# Patient Record
Sex: Female | Born: 1990
Health system: Southern US, Community
[De-identification: ages and names within clinical notes are randomized; demographics above are authoritative.]

## PROBLEM LIST (undated history)

## (undated) DIAGNOSIS — R112 Nausea with vomiting, unspecified: Secondary | ICD-10-CM

## (undated) DIAGNOSIS — Z9889 Other specified postprocedural states: Secondary | ICD-10-CM

## (undated) DIAGNOSIS — T4145XA Adverse effect of unspecified anesthetic, initial encounter: Secondary | ICD-10-CM

## (undated) DIAGNOSIS — T8859XA Other complications of anesthesia, initial encounter: Secondary | ICD-10-CM

## (undated) DIAGNOSIS — IMO0002 Reserved for concepts with insufficient information to code with codable children: Secondary | ICD-10-CM

## (undated) DIAGNOSIS — Z789 Other specified health status: Secondary | ICD-10-CM

## (undated) HISTORY — DX: Reserved for concepts with insufficient information to code with codable children: IMO0002

## (undated) HISTORY — PX: NO PAST SURGERIES: SHX2092

## (undated) HISTORY — PX: WISDOM TOOTH EXTRACTION: SHX21

---
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
## 2012-08-19 ENCOUNTER — Encounter: Payer: Self-pay | Admitting: *Deleted

## 2012-08-20 ENCOUNTER — Encounter: Payer: Self-pay | Admitting: Obstetrics and Gynecology

## 2012-08-20 ENCOUNTER — Encounter: Payer: Self-pay | Admitting: *Deleted

## 2012-08-20 ENCOUNTER — Ambulatory Visit (INDEPENDENT_AMBULATORY_CARE_PROVIDER_SITE_OTHER): Payer: 59 | Admitting: Obstetrics and Gynecology

## 2012-08-20 VITALS — BP 100/60 | Wt 148.0 lb

## 2012-08-20 DIAGNOSIS — N390 Urinary tract infection, site not specified: Secondary | ICD-10-CM

## 2012-08-20 LAB — POCT URINALYSIS DIPSTICK
Glucose, UA: NEGATIVE
Nitrite, UA: POSITIVE
Urobilinogen, UA: NEGATIVE

## 2012-08-20 MED ORDER — NITROFURANTOIN MONOHYD MACRO 100 MG PO CAPS: 100.0000 mg | ORAL_CAPSULE | Freq: Two times a day (BID) | ORAL | Status: DC

## 2012-08-20 NOTE — Patient Instructions (Signed)
Urinary Tract Infection Urinary tract infections (UTIs) can develop anywhere along your urinary tract. Your urinary tract is your body's drainage system for removing wastes and extra water. Your urinary tract includes two kidneys, two ureters, a bladder, and a urethra. Your kidneys are a pair of bean-shaped organs. Each kidney is about the size of your fist. They are located below your ribs, one on each side of your spine. CAUSES Infections are caused by microbes, which are microscopic organisms, including fungi, viruses, and bacteria. These organisms are so small that they can only be seen through a microscope. Bacteria are the microbes that most commonly cause UTIs. SYMPTOMS  Symptoms of UTIs may vary by age and gender of the patient and by the location of the infection. Symptoms in young women typically include a frequent and intense urge to urinate and a painful, burning feeling in the bladder or urethra during urination. Older women and men are more likely to be tired, shaky, and weak and have muscle aches and abdominal pain. A fever may mean the infection is in your kidneys. Other symptoms of a kidney infection include pain in your back or sides below the ribs, nausea, and vomiting. DIAGNOSIS To diagnose a UTI, your caregiver will ask you about your symptoms. Your caregiver also will ask to provide a urine sample. The urine sample will be tested for bacteria and white blood cells. White blood cells are made by your body to help fight infection. TREATMENT  Typically, UTIs can be treated with medication. Because most UTIs are caused by a bacterial infection, they usually can be treated with the use of antibiotics. The choice of antibiotic and length of treatment depend on your symptoms and the type of bacteria causing your infection. HOME CARE INSTRUCTIONS  If you were prescribed antibiotics, take them exactly as your caregiver instructs you. Finish the medication even if you feel better after you  have only taken some of the medication.  Drink enough water and fluids to keep your urine clear or pale yellow.  Avoid caffeine, tea, and carbonated beverages. They tend to irritate your bladder.  Empty your bladder often. Avoid holding urine for long periods of time.  Empty your bladder before and after sexual intercourse.  After a bowel movement, women should cleanse from front to back. Use each tissue only once. SEEK MEDICAL CARE IF:   You have back pain.  You develop a fever.  Your symptoms do not begin to resolve within 3 days. SEEK IMMEDIATE MEDICAL CARE IF:   You have severe back pain or lower abdominal pain.  You develop chills.  You have nausea or vomiting.  You have continued burning or discomfort with urination. MAKE SURE YOU:   Understand these instructions.  Will watch your condition.  Will get help right away if you are not doing well or get worse. Document Released: 01/03/2005 Document Revised: 09/25/2011 Document Reviewed: 05/04/2011 [employer] Patient Information 2013 [employer].  

## 2012-08-20 NOTE — Progress Notes (Signed)
S: 21 y.o.Single Caucasian female presents with burning with urination for a week, on and off.  . Pertinent negatives include The patient is having no constitutional symptoms, denying fever, chills, anorexia, or weight loss..  Symptoms related to post coital No Current method of birth control COC'S. Partner without change. Also some vaginal itching over the last week.   ROS: no weight loss, fever, night sweats  O:  WF in NAD.  No CVAT.  Abd soft with only minimal suprapubic tenderness.  Pelvic:  Ext nl, w/o erythema or lesions.  Vag with some thin d/c but wet prep was nl and pH was 4.5.  Cx nl.  BUS neg.  BM exam showed the uterus was small, mobile, and nontender.  Adnexa neg.       Diagnostic Test:    Urinalysis  Color, UA orange     Clarity, UA cloudy     Glucose, UA n     Bilirubin, UA n     Ketones, UA n     Spec Grav, UA    Blood, UA 50     pH, UA 5.0     Protein, UA small     Urobilinogen, UA negative     Nitrite, UA pos     Leukocytes, UA small (1+)      Specimen Collected: 08/20/12 2:00 PM       Assessment: UTI, no evidence of vaginitis  P:  Macrobid bid for 7 days.  Hydrate.  Return if sx's not improved in 48 hours.       Patient is given AVS

## 2012-11-06 ENCOUNTER — Telehealth: Payer: Self-pay | Admitting: Obstetrics & Gynecology

## 2012-11-06 NOTE — Telephone Encounter (Signed)
Patient mom "Vernona Rieger" says she is returning a call to [facility]. Re: forms for prescription refill

## 2012-11-07 MED ORDER — NORETHIN ACE-ETH ESTRAD-FE 1-20 MG-MCG PO TABS: 1.0000 | ORAL_TABLET | Freq: Every day | ORAL | Status: DC

## 2012-11-07 NOTE — Telephone Encounter (Signed)
Fax from [facility] stating that RX sent 04/2012 was filled at local pharmacy and mail order pharmacy will need new RX.  They can not transfer from local pharmacy.  RX sent to [facility] to last until AEX.

## 2013-04-14 ENCOUNTER — Other Ambulatory Visit: Payer: Self-pay | Admitting: Obstetrics & Gynecology

## 2013-04-14 MED ORDER — NORETHIN ACE-ETH ESTRAD-FE 1-20 MG-MCG PO TABS: ORAL_TABLET | ORAL | Status: DC

## 2013-04-14 NOTE — Addendum Note (Signed)
Addended by: Joselyn Arrow on: 04/14/2013 10:20 AM   Modules accepted: Orders

## 2013-05-12 ENCOUNTER — Ambulatory Visit (INDEPENDENT_AMBULATORY_CARE_PROVIDER_SITE_OTHER): Payer: 59 | Admitting: Obstetrics & Gynecology

## 2013-05-12 ENCOUNTER — Ambulatory Visit: Payer: Self-pay | Admitting: Obstetrics and Gynecology

## 2013-05-12 ENCOUNTER — Encounter: Payer: Self-pay | Admitting: Obstetrics & Gynecology

## 2013-05-12 VITALS — BP 102/76 | HR 80 | Resp 16 | Ht 66.0 in | Wt 154.0 lb

## 2013-05-12 DIAGNOSIS — Z0000 Encounter for general adult medical examination without abnormal findings: Secondary | ICD-10-CM

## 2013-05-12 DIAGNOSIS — Z01419 Encounter for gynecological examination (general) (routine) without abnormal findings: Secondary | ICD-10-CM

## 2013-05-12 LAB — POCT URINALYSIS DIPSTICK
BILIRUBIN UA: NEGATIVE
GLUCOSE UA: NEGATIVE
KETONES UA: NEGATIVE
Leukocytes, UA: NEGATIVE
NITRITE UA: NEGATIVE
PH UA: 8
Protein, UA: NEGATIVE
Urobilinogen, UA: NEGATIVE

## 2013-05-12 MED ORDER — NORETHIN ACE-ETH ESTRAD-FE 1-20 MG-MCG PO TABS: ORAL_TABLET | ORAL | Status: DC

## 2013-05-12 NOTE — Progress Notes (Signed)
23 y.o. G1P0010 SingleCaucasianF here for annual exam.  On OCPs, same one for years.  Doesn't usually cycle on this.  +SA with same person for 3 1/2 years.  Had Gc/Chl testing in 2013.  Declines testing today.  Has a small area at the rectum she wants me to check--is it a hemorrhoid?  No LMP recorded.          Sexually active: yes  The current method of family planning is Junel   Exercising: yes  Walking and Horse riding  Smoker:  no  Health Maintenance: Pap:  05/09/2012 - normal History of abnormal Pap:  Yes 2008 MMG:  None Colonoscopy:  none BMD:   none TDaP:  05/09/2012 Screening Labs: none, Hb today: 14.6, Urine today: Blood = trace.    reports that she quit smoking about 13 months ago. She has never used smokeless tobacco. She reports that she drinks about 7.0 ounces of alcohol per week. She reports that she does not use illicit drugs.  Past Medical History  Diagnosis Date  . 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  Current Outpatient Prescriptions  Medication Sig Dispense Refill  . nitrofurantoin, macrocrystal-monohydrate, (MACROBID) 100 MG capsule Take 1 capsule (100 mg total) by mouth 2 (two) times daily.  14 capsule  0  . norethindrone-ethinyl estradiol (GILDESS FE 1/20) 1-20 MG-MCG tablet TAKE 1 TABLET DAILY  3 Package  0   No current facility-administered medications for this visit.    Family History  Problem Relation Age of Onset  . Breast cancer Mother 46  . Diabetes Father     ROS:  Pertinent items are noted in HPI.  Otherwise, a comprehensive ROS was negative.  Exam:   BP 102/76  Pulse 80  Resp 16  Ht 5\' 6"  (1.676 m)  Wt 154 lb (69.854 kg)  BMI 24.87 kg/m2  Weight change: +2lb   Height: 5\' 6"  (167.6 cm)  Ht Readings from Last 3 Encounters:  05/12/13 5\' 6"  (1.676 m)    General appearance: alert, cooperative and appears stated age Head: Normocephalic, without obvious abnormality, atraumatic Neck: no adenopathy, supple,  symmetrical, trachea midline and thyroid normal to inspection and palpation Lungs: clear to auscultation bilaterally Breasts: normal appearance, no masses or tenderness Heart: regular rate and rhythm Abdomen: soft, non-tender; bowel sounds normal; no masses,  no organomegaly Extremities: extremities normal, atraumatic, no cyanosis or edema Skin: Skin color, texture, turgor normal. No rashes or lesions Lymph nodes: Cervical, supraclavicular, and axillary nodes normal. No abnormal inguinal nodes palpated Neurologic: Grossly normal   Pelvic: External genitalia:  no lesions              Urethra:  normal appearing urethra with no masses, tenderness or lesions              Bartholins and Skenes: normal                 Vagina: normal appearing vagina with normal color and discharge, no lesions              Cervix: no lesions              Pap taken: no Bimanual Exam:  Uterus:  normal size, contour, position, consistency, mobility, non-tender              Adnexa: normal adnexa and no mass, fullness, tenderness  Anus:  normal sphincter tone, no lesions, small hemorrhoid at 6 o'clock on the rectum  A:  Well Woman with normal exam Family hx of breast cancer--mother age 18  P:   Mammograms will be started at age 82.  Pt aware of this. pap smear with neg HR HPV testing 2/14.  No Pap today.   Declines STD testing today. Gildess rx to pharmacy.  #24mo supply/4RF return annually or prn  An After Visit Summary was printed and given to the patient.

## 2013-05-12 NOTE — Patient Instructions (Signed)

## 2013-05-13 LAB — HEMOGLOBIN, FINGERSTICK: Hemoglobin, fingerstick: 14.6 g/dL (ref 12.0–16.0)

## 2013-07-29 ENCOUNTER — Ambulatory Visit (INDEPENDENT_AMBULATORY_CARE_PROVIDER_SITE_OTHER): Payer: 59 | Admitting: Emergency Medicine

## 2013-07-29 ENCOUNTER — Encounter: Payer: Self-pay | Admitting: Emergency Medicine

## 2013-07-29 VITALS — BP 110/70 | HR 104 | Temp 100.8°F | Resp 18 | Ht 68.0 in | Wt 154.0 lb

## 2013-07-29 DIAGNOSIS — R509 Fever, unspecified: Secondary | ICD-10-CM

## 2013-07-29 DIAGNOSIS — J020 Streptococcal pharyngitis: Secondary | ICD-10-CM

## 2013-07-29 LAB — POCT RAPID STREP A (OFFICE): RAPID STREP A SCREEN: NEGATIVE

## 2013-07-29 MED ORDER — PREDNISONE 10 MG PO TABS: ORAL_TABLET | ORAL | Status: DC

## 2013-07-29 MED ORDER — AZITHROMYCIN 250 MG PO TABS: ORAL_TABLET | ORAL | Status: AC

## 2013-07-29 NOTE — Progress Notes (Signed)
   Subjective:    Patient ID: Deborah Simpson, female    DOB: 03-10-1991, 23 y.o.   MRN: [id number]  HPI Comments: 23 yo female with 3 days of increasing sore throat. She denies any relief with Advil/ Tylenol for fever and aches. She notes sore throat/ ears/ back ache.  She denies any recent exposures to strept.   Sore Throat   Fever  Associated symptoms include a sore throat.     Medication List       This list is accurate as of: 07/29/13  3:15 PM.  Always use your most recent med list.               norethindrone-ethinyl estradiol 1-20 MG-MCG tablet  Commonly known as:  GILDESS FE 1/20  TAKE 1 TABLET DAILY       No Known Allergies Past Medical History  Diagnosis Date  . Abnormal pap 09/08    CIN 1      Review of Systems  Constitutional: Positive for fever and fatigue.  HENT: Positive for sore throat.   Musculoskeletal: Positive for back pain.  All other systems reviewed and are negative.  BP 110/70  Pulse 104  Temp(Src) 100.8 F (38.2 C) (Temporal)  Resp 18  Ht 5\' 8"  (3.149 m)  Wt 154 lb (69.854 kg)  BMI 23.42 kg/m2  LMP 07/08/2013     Objective:   Physical Exam  Nursing note and vitals reviewed. Constitutional: She is oriented to person, place, and time. She appears well-developed and well-nourished.  HENT:  Head: Normocephalic and atraumatic.  Right Ear: External ear normal.  Left Ear: External ear normal.  Nose: Nose normal.  Mouth/Throat: Oropharyngeal exudate present.  2+ erythema, yellow bullous R>L  Eyes: Conjunctivae and EOM are normal.  Neck: Normal range of motion.  Cardiovascular: Normal rate, regular rhythm, normal heart sounds and intact distal pulses.   Pulmonary/Chest: Effort normal and breath sounds normal.  Abdominal: Soft. There is no tenderness.  Musculoskeletal: Normal range of motion.  Lymphadenopathy:    She has no cervical adenopathy.  Neurological: She is alert and oriented to person, place, and time.  Skin: Skin is  warm and dry.  Psychiatric: She has a normal mood and affect. Judgment normal.     Neg Rapid Strept     Assessment & Plan:  1. Prob Strept- ZPAK, Pred DP 10 mg Both AD Warm salt water gargles daily. 1 tsp liquid benadryl + 1 tsp liquid Maalox, MIX/ GARGLE/ SPIT as needed for pain. OOW x 3 days, w/c if SX increase or ER. Push fluids  2. Allergic rhinitis- Allegra OTC, increase H2o, allergy hygiene explained.

## 2013-07-29 NOTE — Patient Instructions (Signed)
FYI Peritonsillar Abscess Peritonsillar abscess is a collection of yellowish white fluid (pus) in the back of the throat. This fluid forms behind the tonsils. The treatment is most often drainage. This is done by:  Putting a needle into the abscess.  Cutting and draining the abscess. HOME CARE  If your abscess was drained today:  Mix 1 teaspoon of salt in 8 ounces of warm water for gargling.  Gargle the warm salt water.  Gargle 4 times per day or as needed for comfort. Do not swallow this mixture.  Rest in bed as needed.  Return to normal activity as soon as you can.  Apply cold to your neck for pain relief.  Put ice in a plastic bag.  Place a towel between your skin and the bag.  Leave the ice on for 15-20 minutes at a time, 03-04 times a day.  Eat a soft or liquid diet. Drink cold fluids. Cold fluids will soothe and take puffiness (swelling) down.  Only take medicine as told by your doctor.  Take all medicine as told. GET HELP RIGHT AWAY IF:   You are coughing up or throwing up (vomiting) blood.  Your throat pain is severe and pain medicine does not help it.  You have trouble talking or breathing.  You have trouble swallowing or eating.  You find it easier to breathe while leaning forward.  You have pain that gets worse.  You have pain, puffiness, redness, or drainage in your throat.  You have a fever.  You become dizzy, have a headache, have low energy, or feel sick.  You have signs of body fluid loss (dehydration). This includes lightheadedness when standing, peeing (urinating) less, a fast heart rate, or dry mouth and nose.  You start to drool. MAKE SURE YOU:  Understand these instructions.  Will watch your condition.  Will get help if you are not doing well or get worse. Document Released: 03/14/2009 Document Revised: 06/18/2011 Document Reviewed: 03/14/2009 [facility] Patient Information 2014 [place], [place]. Strep Throat Strep throat is an  infection of the throat caused by a bacteria named Streptococcus pyogenes. Your caregiver may call the infection streptococcal "tonsillitis" or "pharyngitis" depending on whether there are signs of inflammation in the tonsils or back of the throat. Strep throat is most common in children aged 5 15 years during the cold months of the year, but it can occur in people of any age during any season. This infection is spread from person to person (contagious) through coughing, sneezing, or other close contact. SYMPTOMS   Fever or chills.  Painful, swollen, red tonsils or throat.  Pain or difficulty when swallowing.  White or yellow spots on the tonsils or throat.  Swollen, tender lymph nodes or "glands" of the neck or under the jaw.  Red rash all over the body (rare). DIAGNOSIS  Many different infections can cause the same symptoms. A test must be done to confirm the diagnosis so the right treatment can be given. A "rapid strep test" can help your caregiver make the diagnosis in a few minutes. If this test is not available, a light swab of the infected area can be used for a throat culture test. If a throat culture test is done, results are usually available in a day or two. TREATMENT  Strep throat is treated with antibiotic medicine. HOME CARE INSTRUCTIONS   Gargle with 1 tsp of salt in 1 cup of warm water, 3 4 times per day or as needed for comfort.  Family members who also have a sore throat or fever should be tested for strep throat and treated with antibiotics if they have the strep infection.  Make sure everyone in your household washes their hands well.  Do not share food, drinking cups, or personal items that could cause the infection to spread to others.  You may need to eat a soft food diet until your sore throat gets better.  Drink enough water and fluids to keep your urine clear or pale yellow. This will help prevent dehydration.  Get plenty of rest.  Stay home from school,  daycare, or work until you have been on antibiotics for 24 hours.  Only take over-the-counter or prescription medicines for pain, discomfort, or fever as directed by your caregiver.  If antibiotics are prescribed, take them as directed. Finish them even if you start to feel better. SEEK MEDICAL CARE IF:   The glands in your neck continue to enlarge.  You develop a rash, cough, or earache.  You cough up green, yellow-brown, or bloody sputum.  You have pain or discomfort not controlled by medicines.  Your problems seem to be getting worse rather than better. SEEK IMMEDIATE MEDICAL CARE IF:   You develop any new symptoms such as vomiting, severe headache, stiff or painful neck, chest pain, shortness of breath, or trouble swallowing.  You develop severe throat pain, drooling, or changes in your voice.  You develop swelling of the neck, or the skin on the neck becomes red and tender.  You have a fever.  You develop signs of dehydration, such as fatigue, dry mouth, and decreased urination.  You become increasingly sleepy, or you cannot wake up completely. Document Released: 03/23/2000 Document Revised: 03/12/2012 Document Reviewed: 05/25/2010 [facility] Patient Information 2014 [place], [place].

## 2013-07-30 DIAGNOSIS — J020 Streptococcal pharyngitis: Secondary | ICD-10-CM | POA: Insufficient documentation

## 2014-01-11 ENCOUNTER — Encounter: Payer: Self-pay | Admitting: Physician Assistant

## 2014-01-11 ENCOUNTER — Ambulatory Visit (INDEPENDENT_AMBULATORY_CARE_PROVIDER_SITE_OTHER): Payer: 59 | Admitting: Physician Assistant

## 2014-01-11 VITALS — BP 120/78 | HR 84 | Temp 98.5°F | Resp 16 | Ht 68.0 in | Wt 158.0 lb

## 2014-01-11 DIAGNOSIS — R300 Dysuria: Secondary | ICD-10-CM

## 2014-01-11 DIAGNOSIS — R109 Unspecified abdominal pain: Secondary | ICD-10-CM

## 2014-01-11 MED ORDER — CYCLOBENZAPRINE HCL 10 MG PO TABS: 10.0000 mg | ORAL_TABLET | Freq: Three times a day (TID) | ORAL | Status: DC | PRN

## 2014-01-11 MED ORDER — MELOXICAM 15 MG PO TABS: ORAL_TABLET | ORAL | Status: DC

## 2014-01-11 NOTE — Patient Instructions (Signed)
Flank Pain °Flank pain refers to pain that is located on the side of the body between the upper abdomen and the back. The pain may occur over a short period of time (acute) or may be long-term or reoccurring (chronic). It may be mild or severe. Flank pain can be caused by many things. °CAUSES  °Some of the more common causes of flank pain include: °· Muscle strains.   °· Muscle spasms.   °· A disease of your spine (vertebral disk disease).   °· A lung infection (pneumonia).   °· Fluid around your lungs (pulmonary edema).   °· A kidney infection.   °· Kidney stones.   °· A very painful skin rash caused by the chickenpox virus (shingles).   °· Gallbladder disease.   °HOME CARE INSTRUCTIONS  °Home care will depend on the cause of your pain. In general, °· Rest as directed by your caregiver. °· Drink enough fluids to keep your urine clear or pale yellow. °· Only take over-the-counter or prescription medicines as directed by your caregiver. Some medicines may help relieve the pain. °· Tell your caregiver about any changes in your pain. °· Follow up with your caregiver as directed. °SEEK IMMEDIATE MEDICAL CARE IF:  °· Your pain is not controlled with medicine.   °· You have new or worsening symptoms. °· Your pain increases.   °· You have abdominal pain.   °· You have shortness of breath.   °· You have persistent nausea or vomiting.   °· You have swelling in your abdomen.   °· You feel faint or pass out.   °· You have blood in your urine. °· You have a fever or persistent symptoms for more than 2-3 days. °· You have a fever and your symptoms suddenly get worse. °MAKE SURE YOU:  °· Understand these instructions. °· Will watch your condition. °· Will get help right away if you are not doing well or get worse. °Document Released: 05/17/2005 Document Revised: 12/19/2011 Document Reviewed: 11/08/2011 °[employer]® Patient Information ©2015 [employer]. This information is not intended to replace advice given to you by your  health care provider. Make sure you discuss any questions you have with your health care provider. ° °

## 2014-01-11 NOTE — Progress Notes (Signed)
   Subjective:    Patient ID: Deborah Simpson, female    DOB: Jun 24, 1990, 23 y.o.   MRN: [id number]  HPI 23 y.o. female with dysuria and lower back pain over the weekend. Friday started with frequency, cloudy urine, with dysuria with urination, she increased water/cranberry juice and took OTC azo, she then started to have right sided/flank pain with some radiation to front. Currently she denies urinary symptoms but continue to have lower back pain. Denies fever, chills, nausea, diarrhea, vomiting. Has been seeing a chiropractor for a pulled muscle in lower back.   Sexually active same boyfriend for 3-4 years, on BCP. Last menses beginning of last month, does not have menses every month.    Review of Systems  Constitutional: Negative.   HENT: Negative.   Respiratory: Negative.   Cardiovascular: Negative.   Gastrointestinal: Negative.   Genitourinary: Positive for dysuria, frequency and flank pain. Negative for urgency, hematuria, decreased urine volume, vaginal bleeding, vaginal discharge, enuresis, difficulty urinating, genital sores, vaginal pain, menstrual problem, pelvic pain and dyspareunia.  Musculoskeletal: Positive for back pain. Negative for arthralgias, gait problem, joint swelling and myalgias.  Neurological: Negative.        Objective:   Physical Exam  Constitutional: She is oriented to person, place, and time. She appears well-developed and well-nourished.  Neck: Normal range of motion. Neck supple.  Cardiovascular: Normal rate and regular rhythm.   Pulmonary/Chest: Effort normal and breath sounds normal.  Abdominal: Soft. Bowel sounds are normal. She exhibits no distension and no mass. There is tenderness (suprapubic ). There is no rebound and no guarding.  Musculoskeletal: Normal range of motion. She exhibits no tenderness.  Neurological: She is alert and oriented to person, place, and time.  Skin: Skin is warm and dry.      Assessment & Plan:  Right flank pain with  urinary symptoms- + sexually active ? UTI, kidney stone, kidney infection, preg, musculoskeletal Check Urine and pregnancy test, CBC CMET.

## 2014-01-12 LAB — COMPREHENSIVE METABOLIC PANEL
ALT: 21 U/L (ref 0–35)
AST: 14 U/L (ref 0–37)
Albumin: 4.3 g/dL (ref 3.5–5.2)
Alkaline Phosphatase: 62 U/L (ref 39–117)
BILIRUBIN TOTAL: 0.4 mg/dL (ref 0.2–1.2)
BUN: 9 mg/dL (ref 6–23)
CO2: 28 mEq/L (ref 19–32)
Calcium: 9.3 mg/dL (ref 8.4–10.5)
Chloride: 100 mEq/L (ref 96–112)
Creat: 0.84 mg/dL (ref 0.50–1.10)
GLUCOSE: 91 mg/dL (ref 70–99)
POTASSIUM: 3.7 meq/L (ref 3.5–5.3)
SODIUM: 139 meq/L (ref 135–145)
TOTAL PROTEIN: 6.8 g/dL (ref 6.0–8.3)

## 2014-01-12 LAB — CBC WITH DIFFERENTIAL/PLATELET
BASOS PCT: 0 % (ref 0–1)
Basophils Absolute: 0 10*3/uL (ref 0.0–0.1)
EOS ABS: 0 10*3/uL (ref 0.0–0.7)
Eosinophils Relative: 0 % (ref 0–5)
HCT: 41.5 % (ref 36.0–46.0)
Hemoglobin: 13.9 g/dL (ref 12.0–15.0)
Lymphocytes Relative: 22 % (ref 12–46)
Lymphs Abs: 3 10*3/uL (ref 0.7–4.0)
MCH: 28.7 pg (ref 26.0–34.0)
MCHC: 33.5 g/dL (ref 30.0–36.0)
MCV: 85.6 fL (ref 78.0–100.0)
MONOS PCT: 7 % (ref 3–12)
Monocytes Absolute: 0.9 10*3/uL (ref 0.1–1.0)
NEUTROS ABS: 9.6 10*3/uL — AB (ref 1.7–7.7)
NEUTROS PCT: 71 % (ref 43–77)
Platelets: 343 10*3/uL (ref 150–400)
RBC: 4.85 MIL/uL (ref 3.87–5.11)
RDW: 13 % (ref 11.5–15.5)
WBC: 13.5 10*3/uL — ABNORMAL HIGH (ref 4.0–10.5)

## 2014-01-12 LAB — URINALYSIS, ROUTINE W REFLEX MICROSCOPIC
Bilirubin Urine: NEGATIVE
GLUCOSE, UA: NEGATIVE mg/dL
KETONES UR: NEGATIVE mg/dL
Nitrite: POSITIVE — AB
Protein, ur: NEGATIVE mg/dL
Urobilinogen, UA: 0.2 mg/dL (ref 0.0–1.0)
pH: 6 (ref 5.0–8.0)

## 2014-01-12 LAB — URINALYSIS, MICROSCOPIC ONLY
Casts: NONE SEEN
Crystals: NONE SEEN
SQUAMOUS EPITHELIAL / LPF: NONE SEEN

## 2014-01-12 MED ORDER — CIPROFLOXACIN HCL 500 MG PO TABS: 500.0000 mg | ORAL_TABLET | Freq: Two times a day (BID) | ORAL | Status: AC

## 2014-01-12 NOTE — Addendum Note (Signed)
Addended by: Vicie Mutters R on: 01/12/2014 08:39 AM   Modules accepted: Orders

## 2014-01-14 LAB — URINE CULTURE

## 2014-02-08 ENCOUNTER — Encounter: Payer: Self-pay | Admitting: Physician Assistant

## 2014-02-15 ENCOUNTER — Other Ambulatory Visit: Payer: Self-pay

## 2014-05-31 ENCOUNTER — Ambulatory Visit: Payer: 59 | Admitting: Obstetrics & Gynecology

## 2014-06-08 ENCOUNTER — Other Ambulatory Visit: Payer: Self-pay | Admitting: Obstetrics & Gynecology

## 2014-06-08 NOTE — Telephone Encounter (Signed)
Medication refill request: Gildess 1/20  Last AEX:  05/12/13 with SM Next AEX: 06/14/14 with SM Last MMG (if hormonal medication request): N/A Refill authorized: None- Patient has enough refills to last her  Called patient to try and schedule her for AEX. Scheduled her for 06/14/14 at 12:45 with Dr. Sabra Heck. Asked patient if we needed to send in refills for her. She said she has plenty to last her until AEX.  Rx denied through our system  Routed to provider for review, encounter closed.

## 2014-06-14 ENCOUNTER — Ambulatory Visit (INDEPENDENT_AMBULATORY_CARE_PROVIDER_SITE_OTHER): Payer: 59 | Admitting: Obstetrics & Gynecology

## 2014-06-14 ENCOUNTER — Encounter: Payer: Self-pay | Admitting: Obstetrics & Gynecology

## 2014-06-14 VITALS — BP 120/62 | HR 64 | Resp 16 | Ht 67.0 in | Wt 158.0 lb

## 2014-06-14 DIAGNOSIS — Z124 Encounter for screening for malignant neoplasm of cervix: Secondary | ICD-10-CM | POA: Diagnosis not present

## 2014-06-14 DIAGNOSIS — Z01419 Encounter for gynecological examination (general) (routine) without abnormal findings: Secondary | ICD-10-CM

## 2014-06-14 DIAGNOSIS — Z0000 Encounter for general adult medical examination without abnormal findings: Secondary | ICD-10-CM | POA: Diagnosis not present

## 2014-06-14 LAB — POCT URINALYSIS DIPSTICK
Bilirubin, UA: NEGATIVE
Glucose, UA: NEGATIVE
KETONES UA: NEGATIVE
NITRITE UA: NEGATIVE
PROTEIN UA: NEGATIVE
Urobilinogen, UA: NEGATIVE
pH, UA: 5

## 2014-06-14 LAB — HEMOGLOBIN, FINGERSTICK: HEMOGLOBIN, FINGERSTICK: 14.4 g/dL (ref 12.0–16.0)

## 2014-06-14 MED ORDER — NORETHIN ACE-ETH ESTRAD-FE 1-20 MG-MCG PO TABS: ORAL_TABLET | ORAL | Status: DC

## 2014-06-14 NOTE — Progress Notes (Signed)
24 y.o. G1P0010 SingleCaucasianF here for annual exam.  Doing well.  Cycles normal.  Mother with hx of breast cancer age 37.  Pt aware screening will start at age 70.   Still dating same person.  They have been together for almost five years.      Patient's last menstrual period was 05/28/2014.          Sexually active: Yes.    The current method of family planning is OCP (estrogen/progesterone).    Exercising: Yes.    walking dogs Smoker:  no  Health Maintenance: Pap:  05/09/12 WNL negative HR HPV History of abnormal Pap:  yes MMG:  Breast US-normal Colonoscopy:  none BMD:   none TDaP:  05/09/12 Screening Labs: normal CMP/hb 10/15, Hb today: 14.4, Urine today: WBC-trace, RBC-1+   reports that she has never smoked. She has never used smokeless tobacco. She reports that she drinks about 7.0 oz of alcohol per week. She reports that she does not use illicit drugs.  Past Medical History  Diagnosis Date  . 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  Current Outpatient Prescriptions  Medication Sig Dispense Refill  . norethindrone-ethinyl estradiol (GILDESS FE 1/20) 1-20 MG-MCG tablet TAKE 1 TABLET DAILY 3 Package 4   No current facility-administered medications for this visit.    Family History  Problem Relation Age of Onset  . Breast cancer Mother 38  . Diabetes Father     ROS:  Pertinent items are noted in HPI.  Otherwise, a comprehensive ROS was negative.  Exam:   General appearance: alert, cooperative and appears stated age Head: Normocephalic, without obvious abnormality, atraumatic Neck: no adenopathy, supple, symmetrical, trachea midline and thyroid normal to inspection and palpation Lungs: clear to auscultation bilaterally Breasts: normal appearance, no masses or tenderness Heart: regular rate and rhythm Abdomen: soft, non-tender; bowel sounds normal; no masses,  no organomegaly Extremities: extremities normal, atraumatic, no cyanosis or  edema Skin: Skin color, texture, turgor normal. No rashes or lesions Lymph nodes: Cervical, supraclavicular, and axillary nodes normal. No abnormal inguinal nodes palpated Neurologic: Grossly normal   Pelvic: External genitalia:  no lesions              Urethra:  normal appearing urethra with no masses, tenderness or lesions              Bartholins and Skenes: normal                 Vagina: normal appearing vagina with normal color and discharge, no lesions              Cervix: no lesions              Pap taken: Yes.   Bimanual Exam:  Uterus:  normal size, contour, position, consistency, mobility, non-tender              Adnexa: normal adnexa and no mass, fullness, tenderness               Rectovaginal: Confirms               Anus:  normal sphincter tone, no lesions  Chaperone was present for exam.  A:  Well Woman with normal exam Family hx of breast cancer--mother age 30 On OCPs for contraception  P: Mammograms will be started at age 63. Pt aware of this. pap smear with neg HR HPV testing 2/14. Pap only today.  Declines STD  testing due to being in long term relationship Gildess rx to pharmacy. #56mo supply/4RF return annually or prn

## 2014-06-15 LAB — IPS PAP TEST WITH REFLEX TO HPV

## 2015-05-10 ENCOUNTER — Encounter: Payer: Self-pay | Admitting: Internal Medicine

## 2015-05-10 ENCOUNTER — Ambulatory Visit (INDEPENDENT_AMBULATORY_CARE_PROVIDER_SITE_OTHER): Payer: 59 | Admitting: Internal Medicine

## 2015-05-10 VITALS — BP 138/76 | HR 96 | Temp 98.6°F | Resp 18 | Ht 68.0 in | Wt 162.0 lb

## 2015-05-10 DIAGNOSIS — J069 Acute upper respiratory infection, unspecified: Secondary | ICD-10-CM | POA: Diagnosis not present

## 2015-05-10 MED ORDER — PROMETHAZINE-DM 6.25-15 MG/5ML PO SYRP: ORAL_SOLUTION | ORAL | Status: DC

## 2015-05-10 MED ORDER — PREDNISONE 20 MG PO TABS: ORAL_TABLET | ORAL | Status: DC

## 2015-05-10 NOTE — Progress Notes (Signed)
Patient ID: Deborah Simpson, female   DOB: 1991-02-01, 25 y.o.   MRN: [id number]  HPI  Patient presents to the office for evaluation of cough.  It has been going on for 3 days.  Patient reports minimal coughing.  They also endorse change in voice, chills, postnasal drip, shortness of breath and body aches, nasal congestion, rhinorrhea. .  They have tried nasal saline.  They report that nothing has worked.  They admits to other sick contacts.  Review of Systems  Constitutional: Positive for chills and malaise/fatigue. Negative for fever.  HENT: Positive for congestion. Negative for ear pain and sore throat.   Respiratory: Positive for cough. Negative for shortness of breath and wheezing.   Skin: Negative.   Neurological: Positive for headaches.    PE: Filed Vitals:   05/10/15 1425  BP: 138/76  Pulse: 96  Temp: 98.6 F (37 C)  Resp: 18     General:  Alert and non-toxic, WDWN, NAD HEENT: NCAT, PERLA, EOM normal, no occular discharge or erythema.  Nasal mucosal edema with sinus tenderness to palpation.  Oropharynx clear with minimal oropharyngeal edema and erythema.  Mucous membranes moist and pink. Neck:  Cervical adenopathy Chest:  RRR no MRGs.  Lungs clear to auscultation A&P with no wheezes rhonchi or rales.   Abdomen: +BS x 4 quadrants, soft, non-tender, no guarding, rigidity, or rebound. Skin: warm and dry no rash Neuro: A&Ox4, CN II-XII grossly intact  Assessment and Plan:   1. Acute URI -prednisone -flonase -antihistamine -phenergan dm -followup prn

## 2015-05-10 NOTE — Patient Instructions (Signed)
Please take prednisone as prescribed until it is gone.  Please take claritin, zyrtec, or allegra daily until this resolves.  Please 2 sprays of flonase, nasacort, or rhinocort per nostril until you feel better.  Please use saline in your nose as often as tolerated.  Please take cough syrup as needed for cough and congestion.

## 2015-06-10 ENCOUNTER — Ambulatory Visit (INDEPENDENT_AMBULATORY_CARE_PROVIDER_SITE_OTHER): Payer: 59 | Admitting: Internal Medicine

## 2015-06-10 ENCOUNTER — Encounter: Payer: Self-pay | Admitting: Internal Medicine

## 2015-06-10 VITALS — BP 122/74 | HR 90 | Temp 98.4°F | Resp 18 | Ht 68.0 in | Wt 158.0 lb

## 2015-06-10 DIAGNOSIS — F909 Attention-deficit hyperactivity disorder, unspecified type: Secondary | ICD-10-CM

## 2015-06-10 DIAGNOSIS — F988 Other specified behavioral and emotional disorders with onset usually occurring in childhood and adolescence: Secondary | ICD-10-CM

## 2015-06-10 MED ORDER — BUPROPION HCL ER (XL) 150 MG PO TB24: 150.0000 mg | ORAL_TABLET | ORAL | Status: DC

## 2015-06-10 NOTE — Progress Notes (Signed)
   Subjective:    Patient ID: Deborah Simpson, female    DOB: [birth year], 25 y.o.   MRN: [id number]  HPI  Patient presents to the office for evaluation of difficulty with concentrating.  She reports that she has had some issues with this back in high school but now it is even worse in college.  She reports that she is having one class in particular that she is having trouble passing and that is anatomy and physiology.  She reports that this is the third time that she has taken it.  She reports that she tries to study a lot.  She does flash cards and that seems to help.  She will think about being outside or riding her horse. She does most of her studying in a quiet room.  She does take breaks in her studying.   She feels like life outside of school is fine.  She likes to spend time with her horses.  She reports that she occasional has mood swings that can last 3-4 times per week.  She reports that she is sleeping well.  She doesn't report sleeping too much.  She does report that she is constantly worrying and constantly anxious.  No guilty feelings.  She has some weight gain.      Review of Systems  Constitutional: Negative for fever, chills and fatigue.  Respiratory: Negative for chest tightness and shortness of breath.   Cardiovascular: Negative for chest pain and palpitations.  Gastrointestinal: Negative for nausea, vomiting and abdominal pain.  Genitourinary: Negative.   Psychiatric/Behavioral: Positive for decreased concentration. Negative for confusion and agitation. The patient is nervous/anxious.        Objective:   Physical Exam  Constitutional: She is oriented to person, place, and time. She appears well-developed and well-nourished. No distress.  HENT:  Head: Normocephalic.  Mouth/Throat: Oropharynx is clear and moist. No oropharyngeal exudate.  Eyes: Conjunctivae are normal. No scleral icterus.  Neck: Normal range of motion. Neck supple. No JVD present. No thyromegaly present.   Cardiovascular: Normal rate, regular rhythm, normal heart sounds and intact distal pulses.  Exam reveals no gallop and no friction rub.   No murmur heard. Pulmonary/Chest: Effort normal and breath sounds normal. No respiratory distress. She has no wheezes. She has no rales. She exhibits no tenderness.  Abdominal: Soft. Bowel sounds are normal. She exhibits no distension and no mass. There is no tenderness. There is no rebound and no guarding.  Musculoskeletal: Normal range of motion.  Lymphadenopathy:    She has no cervical adenopathy.  Neurological: She is alert and oriented to person, place, and time.  Skin: Skin is warm and dry. She is not diaphoretic.  Psychiatric: She has a normal mood and affect. Her behavior is normal. Judgment and thought content normal.  Nursing note and vitals reviewed.   Filed Vitals:   06/10/15 1033  BP: 122/74  Pulse: 90  Temp: 98.4 F (36.9 C)  Resp: 18        Assessment & Plan:    1. ADD (attention deficit disorder) -likely a crossover of anxiety -wellbutrin 150 -recheck 1 month -daily fish oil -recommended that she get set up with a tutor and also meet with the professor for tips understanding difficult subject

## 2015-06-10 NOTE — Patient Instructions (Signed)

## 2015-07-12 ENCOUNTER — Ambulatory Visit (INDEPENDENT_AMBULATORY_CARE_PROVIDER_SITE_OTHER): Payer: 59 | Admitting: Internal Medicine

## 2015-07-12 ENCOUNTER — Encounter: Payer: Self-pay | Admitting: Internal Medicine

## 2015-07-12 VITALS — BP 118/72 | HR 88 | Temp 98.2°F | Resp 18 | Ht 68.0 in | Wt 162.0 lb

## 2015-07-12 DIAGNOSIS — F988 Other specified behavioral and emotional disorders with onset usually occurring in childhood and adolescence: Secondary | ICD-10-CM

## 2015-07-12 DIAGNOSIS — F909 Attention-deficit hyperactivity disorder, unspecified type: Secondary | ICD-10-CM

## 2015-07-12 MED ORDER — AMPHETAMINE-DEXTROAMPHETAMINE 20 MG PO TABS: ORAL_TABLET | ORAL | Status: DC

## 2015-07-12 NOTE — Progress Notes (Signed)
Assessment and Plan:   1. ADD (attention deficit disorder) -d/c wellbutrin --adderall 20 mg daily for 5 days per week -precautions for use listed -patient to send mychart message in 2 weeks about how she is doing with medications.   HPI 25 y.o.female presents for 1 month follow up of issues with concentration.  She was started on Wellbutrin at her last visit. Patient reports that they have been doing well.  female is taking their medication.  They are not having difficulty with their medications.  They report no adverse reactions.  She reports that she noticed no changes in stress or with her focus issues.  She would like to try something.    Past Medical History  Diagnosis Date  . Abnormal pap 09/08    CIN 1     No Known Allergies    Current Outpatient Prescriptions on File Prior to Visit  Medication Sig Dispense Refill  . buPROPion (WELLBUTRIN XL) 150 MG 24 hr tablet Take 1 tablet (150 mg total) by mouth every morning. 30 tablet 2  . norethindrone-ethinyl estradiol (GILDESS FE 1/20) 1-20 MG-MCG tablet TAKE 1 TABLET DAILY 3 Package 4   No current facility-administered medications on file prior to visit.    ROS: all negative except above.   Physical Exam: Filed Weights   07/12/15 1126  Weight: 162 lb (73.483 kg)   BP 118/72 mmHg  Pulse 88  Temp(Src) 98.2 F (36.8 C) (Temporal)  Resp 18  Ht 5\' 8"  (1.727 m)  Wt 162 lb (73.483 kg)  BMI 24.64 kg/m2  LMP 06/28/2015 General Appearance: Well developed well nourished, non-toxic appearing in no apparent distress. Eyes: PERRLA, EOMs, conjunctiva w/ no swelling or erythema or discharge Sinuses: No Frontal/maxillary tenderness ENT/Mouth: Ear canals clear without swelling or erythema.  TM's normal bilaterally with no retractions, bulging, or loss of landmarks.   Neck: Supple, thyroid normal, no notable JVD  Respiratory: Respiratory effort normal, Clear breath sounds anteriorly and posteriorly bilaterally without rales, rhonchi,  wheezing or stridor. No retractions or accessory muscle usage. Cardio: RRR with no MRGs.   Abdomen: Soft, + BS.  Non tender, no guarding, rebound, hernias, masses.  Musculoskeletal: Full ROM, 5/5 strength, normal gait.  Skin: Warm, dry without rashes  Neuro: Awake and oriented X 3, Cranial nerves intact. Normal muscle tone, no cerebellar symptoms. Sensation intact.  Psych: normal affect, Insight and Judgment appropriate.     Starlyn Skeans, PA-C 11:54 AM [facility]

## 2015-07-12 NOTE — Patient Instructions (Signed)
Amphetamine; Dextroamphetamine tablets What is this medicine? AMPHETAMINE; DEXTROAMPHETAMINE(am FET a meen; dex troe am FET a meen) is used to treat attention-deficit hyperactivity disorder (ADHD). It may also be used for narcolepsy. Federal law prohibits giving this medicine to any person other than the person for whom it was prescribed. Do not share this medicine with anyone else. This medicine may be used for other purposes; ask your health care provider or pharmacist if you have questions. What should I tell my health care provider before I take this medicine? They need to know if you have any of these conditions: -anxiety or panic attacks -circulation problems in fingers and toes -glaucoma -hardening or blockages of the arteries or heart blood vessels -heart disease or a heart defect -high blood pressure -history of a drug or alcohol abuse problem -history of stroke -kidney disease -liver disease -mental illness -seizures -suicidal thoughts, plans, or attempt; a previous suicide attempt by you or a family member -thyroid disease -Tourette's syndrome -an unusual or allergic reaction to dextroamphetamine, other amphetamines, other medicines, foods, dyes, or preservatives -pregnant or trying to get pregnant -breast-feeding How should I use this medicine? Take this medicine by mouth with a glass of water. Follow the directions on the prescription label. Take your doses at regular intervals. Do not take your medicine more often than directed. Do not suddenly stop your medicine. You must gradually reduce the dose or you may feel withdrawal effects. Ask your doctor or health care professional for advice. Talk to your pediatrician regarding the use of this medicine in children. Special care may be needed. While this drug may be prescribed for children as young as 3 years for selected conditions, precautions do apply. Overdosage: If you think you have taken too much of this medicine contact a  poison control center or emergency room at once. NOTE: This medicine is only for you. Do not share this medicine with others. What if I miss a dose? If you miss a dose, take it as soon as you can. If it is almost time for your next dose, take only that dose. Do not take double or extra doses. What may interact with this medicine? Do not take this medicine with any of the following medications: -MAOIS like Carbex, Eldepryl, Marplan, Nardil, and Parnate -other stimulant medicines for attention disorders, weight loss, or to stay awake This medicine may also interact with the following medications: -acetazolamide -ammonium chloride -antacids -ascorbic acid -atomoxetine -caffeine -certain medicines for blood pressure -certain medicines for depression, anxiety, or psychotic disturbances -certain medicines for seizures like carbamazepine, phenobarbital, phenytoin -certain medicines for stomach problems like cimetidine, famotidine, omeprazole, lansoprazole -cold or allergy medicines -glutamic acid -lithium -meperidine -methenamine; sodium acid phosphate -narcotic medicines for pain -norepinephrine -phenothiazines like chlorpromazine, mesoridazine, prochlorperazine, thioridazine -sodium acid phosphate -sodium bicarbonate This list may not describe all possible interactions. Give your health care provider a list of all the medicines, herbs, non-prescription drugs, or dietary supplements you use. Also tell them if you smoke, drink alcohol, or use illegal drugs. Some items may interact with your medicine. What should I watch for while using this medicine? Visit your doctor or health care professional for regular checks on your progress. This prescription requires that you follow special procedures with your doctor and pharmacy. You will need to have a new written prescription from your doctor every time you need a refill. This medicine may affect your concentration, or hide signs of tiredness.  Until you know how this medicine affects you, do  not drive, ride a bicycle, use machinery, or do anything that needs mental alertness. Tell your doctor or health care professional if this medicine loses its effects, or if you feel you need to take more than the prescribed amount. Do not change the dosage without talking to your doctor or health care professional. Decreased appetite is a common side effect when starting this medicine. Eating small, frequent meals or snacks can help. Talk to your doctor if you continue to have poor eating habits. Height and weight growth of a child taking this medicine will be monitored closely. Do not take this medicine close to bedtime. It may prevent you from sleeping. If you are going to need surgery, a MRI, CT scan, or other procedure, tell your doctor that you are taking this medicine. You may need to stop taking this medicine before the procedure. Tell your doctor or healthcare professional right away if you notice unexplained wounds on your fingers and toes while taking this medicine. You should also tell your healthcare provider if you experience numbness or pain, changes in the skin color, or sensitivity to temperature in your fingers or toes. What side effects may I notice from receiving this medicine? Side effects that you should report to your doctor or health care professional as soon as possible: -allergic reactions like skin rash, itching or hives, swelling of the face, lips, or tongue -changes in vision -chest pain or chest tightness -confusion, trouble speaking or understanding -fast, irregular heartbeat -fingers or toes feel numb, cool, painful -hallucination, loss of contact with reality -high blood pressure -males: prolonged or painful erection -seizures -severe headaches -shortness of breath -suicidal thoughts or other mood changes -trouble walking, dizziness, loss of balance or coordination -uncontrollable head, mouth, neck, arm, or leg  movements Side effects that usually do not require medical attention (report to your doctor or health care professional if they continue or are bothersome): -anxious -headache -loss of appetite -nausea, vomiting -trouble sleeping -weight loss This list may not describe all possible side effects. Call your doctor for medical advice about side effects. You may report side effects to FDA at 1-800-FDA-1088. Where should I keep my medicine? Keep out of the reach of children. This medicine can be abused. Keep your medicine in a safe place to protect it from theft. Do not share this medicine with anyone. Selling or giving away this medicine is dangerous and against the law. Store at room temperature between 15 and 30 degrees C (59 and 86 degrees F). Keep container tightly closed. Throw away any unused medicine after the expiration date. Dispose of properly. This medicine may cause accidental overdose and death if it is taken by other adults, children, or pets. Mix any unused medicine with a substance like cat litter or coffee grounds. Then throw the medicine away in a sealed container like a sealed bag or a coffee can with a lid. Do not use the medicine after the expiration date. NOTE: This sheet is a summary. It may not cover all possible information. If you have questions about this medicine, talk to your doctor, pharmacist, or health care provider.    2016, Elsevier/Gold Standard. (2014-01-27 18:44:41)

## 2015-08-07 ENCOUNTER — Other Ambulatory Visit: Payer: Self-pay | Admitting: Obstetrics & Gynecology

## 2015-08-08 NOTE — Telephone Encounter (Signed)
Medication refill request: Blisovi 1/20 Last AEX:  06/14/2014 with SM  Next AEX: 08/19/2015 with SM  Last MMG (if hormonal medication request): n/a Refill authorized: #3 packs

## 2015-08-15 ENCOUNTER — Other Ambulatory Visit: Payer: Self-pay | Admitting: *Deleted

## 2015-08-15 MED ORDER — AMPHETAMINE-DEXTROAMPHETAMINE 20 MG PO TABS: ORAL_TABLET | ORAL | Status: DC

## 2015-08-19 ENCOUNTER — Encounter: Payer: Self-pay | Admitting: Obstetrics & Gynecology

## 2015-08-19 ENCOUNTER — Ambulatory Visit (INDEPENDENT_AMBULATORY_CARE_PROVIDER_SITE_OTHER): Payer: 59 | Admitting: Obstetrics & Gynecology

## 2015-08-19 VITALS — BP 106/70 | HR 62 | Resp 12 | Ht 68.0 in | Wt 160.6 lb

## 2015-08-19 DIAGNOSIS — Z0000 Encounter for general adult medical examination without abnormal findings: Secondary | ICD-10-CM

## 2015-08-19 DIAGNOSIS — Z01419 Encounter for gynecological examination (general) (routine) without abnormal findings: Secondary | ICD-10-CM | POA: Diagnosis not present

## 2015-08-19 DIAGNOSIS — Z803 Family history of malignant neoplasm of breast: Secondary | ICD-10-CM

## 2015-08-19 LAB — POCT URINALYSIS DIPSTICK
BILIRUBIN UA: NEGATIVE
Glucose, UA: NEGATIVE
Ketones, UA: NEGATIVE
LEUKOCYTES UA: NEGATIVE
NITRITE UA: NEGATIVE
PH UA: 7
PROTEIN UA: NEGATIVE
UROBILINOGEN UA: NEGATIVE

## 2015-08-19 MED ORDER — NORETHIN ACE-ETH ESTRAD-FE 1-20 MG-MCG PO TABS
1.0000 | ORAL_TABLET | Freq: Every day | ORAL | Status: DC
Start: 2015-08-19 — End: 2016-04-11

## 2015-08-19 NOTE — Progress Notes (Signed)
25 y.o. G1P0010 SingleCaucasianF here for annual exam.  Doing well.  Cycles are regular.  Cycles lasts two to three days and is light.  Declines STD testing.  Together with partner for over six years.  Has just gotten engaged.  Planning to get married next spring.  Did complete the Gardisil vaccine series.  LMP: 07/17/2015          Sexually active: Yes.    The current method of family planning is OCP (estrogen/progesterone).    Exercising: Yes.    walking Smoker:  no  Health Maintenance: Pap:  06/14/2014 negative  History of abnormal Pap:  yes MMG:  none Colonoscopy:  none BMD:   none TDaP:  2016   Screening Labs: drawn , Hb today: drawn today, Urine today: pending   reports that she has never smoked. She has never used smokeless tobacco. She reports that she drinks about 7.0 oz of alcohol per week. She reports that she does not use illicit drugs.  Past Medical History  Diagnosis Date  . 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  Current Outpatient Prescriptions  Medication Sig Dispense Refill  . amphetamine-dextroamphetamine (ADDERALL) 20 MG tablet Take 1 tablet 5 days a week as needed for focus 20 tablet 0  . BLISOVI FE 1/20 1-20 MG-MCG tablet TAKE 1 TABLET DAILY 84 tablet 0   No current facility-administered medications for this visit.    Family History  Problem Relation Age of Onset  . Breast cancer Mother 71  . Diabetes Father     ROS:  Pertinent items are noted in HPI.  Otherwise, a comprehensive ROS was negative.  Exam:   General appearance: alert, cooperative and appears stated age Head: Normocephalic, without obvious abnormality, atraumatic Neck: no adenopathy, supple, symmetrical, trachea midline and thyroid normal to inspection and palpation Lungs: clear to auscultation bilaterally Breasts: normal appearance, no masses or tenderness Heart: regular rate and rhythm Abdomen: soft, non-tender; bowel sounds normal; no masses,  no  organomegaly Extremities: extremities normal, atraumatic, no cyanosis or edema Skin: Skin color, texture, turgor normal. No rashes or lesions Lymph nodes: Cervical, supraclavicular, and axillary nodes normal. No abnormal inguinal nodes palpated Neurologic: Grossly normal   Pelvic: External genitalia:  no lesions              Urethra:  normal appearing urethra with no masses, tenderness or lesions              Bartholins and Skenes: normal                 Vagina: normal appearing vagina with normal color and discharge, no lesions              Cervix: no lesions              Pap taken: No. Bimanual Exam:  Uterus:  normal size, contour, position, consistency, mobility, non-tender              Adnexa: normal adnexa and no mass, fullness, tenderness               Rectovaginal: Confirms               Anus:  normal sphincter tone, no lesions  Chaperone was present for exam.    A: Well Woman with normal exam Family hx of breast cancer--mother age 40 On OCPs for contraception  P: Mammograms will be started at age 89. Will plan  to start screening after her birthday this year.   pap smear with neg HR HPV testing 2/14. Pap neg 2016.  Declines STD testing due to being in long term relationship Blisovi Fe rx to pharmacy. #3 mo supply/4RF return annually or prn

## 2015-08-19 NOTE — Progress Notes (Signed)
Scheduled patient while in office for screening mammogram at [facility] on 08/22/2015 at 10:15 am. Patient is agreeable to date and time.

## 2015-08-22 ENCOUNTER — Telehealth: Payer: Self-pay

## 2015-08-22 NOTE — Telephone Encounter (Signed)
Thank you for the information.  Ok to close encounter.

## 2015-08-22 NOTE — Telephone Encounter (Signed)
Spoke with Janett Billow from [facility] at time of incoming call. Janett Billow states that the patient is in their office today due to family medical history in her mother who was diagnosed with Breast Cancer at 25 y.o. She is scheduled to have a screening mammogram, but per Janett Billow the patient is not due for this until she is 25 years old per guidelines. The patient has spoken with the radiologist who recommends that the patient has bilateral ultrasounds at this time. The patient is in agreeable to this. Advised it is okay to proceed at this time. Janett Billow will fax the order over for Dr.Miller to review and sign.

## 2015-09-15 ENCOUNTER — Other Ambulatory Visit: Payer: Self-pay | Admitting: Physician Assistant

## 2015-09-15 MED ORDER — AMPHETAMINE-DEXTROAMPHETAMINE 20 MG PO TABS: ORAL_TABLET | ORAL | Status: DC

## 2015-09-21 ENCOUNTER — Ambulatory Visit: Payer: Self-pay | Admitting: Physician Assistant

## 2015-09-22 ENCOUNTER — Encounter: Payer: Self-pay | Admitting: Internal Medicine

## 2015-09-22 ENCOUNTER — Ambulatory Visit (INDEPENDENT_AMBULATORY_CARE_PROVIDER_SITE_OTHER): Payer: 59 | Admitting: Internal Medicine

## 2015-09-22 VITALS — BP 110/68 | HR 60 | Temp 98.2°F | Resp 18 | Ht 68.0 in | Wt 164.0 lb

## 2015-09-22 DIAGNOSIS — K529 Noninfective gastroenteritis and colitis, unspecified: Secondary | ICD-10-CM

## 2015-09-22 NOTE — Progress Notes (Signed)
   Subjective:    Patient ID: Deborah Simpson, female    DOB: [birth year], 25 y.o.   MRN: [id number]  HPI  Patient presents to the office for evaluation of nausea, vomiting, diarrhea.  Patient reports that this occurred Friday, Saturday, and Sunday.  She was completely back to normal Monday.  Last episode of diarrhea and vomiting was Sunday morning.  She is back to normal bowel movements, normal diet.  No abdominal pain.    Review of Systems  Constitutional: Negative for fever, chills and fatigue.  Gastrointestinal: Positive for nausea, vomiting and diarrhea. Negative for abdominal pain, constipation, blood in stool and anal bleeding.  Genitourinary: Negative.   Musculoskeletal: Negative.   Neurological: Negative.   Psychiatric/Behavioral: Negative.        Objective:   Physical Exam  Constitutional: She is oriented to person, place, and time. She appears well-developed and well-nourished. No distress.  HENT:  Head: Normocephalic.  Mouth/Throat: Oropharynx is clear and moist. No oropharyngeal exudate.  Eyes: Conjunctivae are normal. No scleral icterus.  Neck: Normal range of motion. Neck supple. No JVD present. No thyromegaly present.  Cardiovascular: Normal rate, regular rhythm, normal heart sounds and intact distal pulses.  Exam reveals no gallop and no friction rub.   No murmur heard. Pulmonary/Chest: Effort normal and breath sounds normal. No respiratory distress. She has no wheezes. She has no rales. She exhibits no tenderness.  Abdominal: Soft. Bowel sounds are normal. She exhibits no distension and no mass. There is no tenderness. There is no rebound and no guarding.  Musculoskeletal: Normal range of motion.  Lymphadenopathy:    She has no cervical adenopathy.  Neurological: She is alert and oriented to person, place, and time.  Skin: Skin is warm and dry. She is not diaphoretic.  Psychiatric: She has a normal mood and affect. Her behavior is normal. Judgment and thought  content normal.  Nursing note and vitals reviewed.   Filed Vitals:   09/22/15 0856  BP: 110/68  Pulse: 60  Temp: 98.2 F (36.8 C)  Resp: 18          Assessment & Plan:    1. Noninfectious gastroenteritis, unspecified -resolved -return to work not given

## 2015-10-17 ENCOUNTER — Ambulatory Visit: Payer: Self-pay | Admitting: Internal Medicine

## 2015-11-30 ENCOUNTER — Other Ambulatory Visit: Payer: Self-pay

## 2015-11-30 MED ORDER — AMPHETAMINE-DEXTROAMPHETAMINE 20 MG PO TABS: ORAL_TABLET | ORAL | 0 refills | Status: DC

## 2016-01-23 ENCOUNTER — Ambulatory Visit: Payer: Self-pay | Admitting: Physician Assistant

## 2016-01-26 ENCOUNTER — Ambulatory Visit (INDEPENDENT_AMBULATORY_CARE_PROVIDER_SITE_OTHER): Payer: 59 | Admitting: Physician Assistant

## 2016-01-26 ENCOUNTER — Encounter: Payer: Self-pay | Admitting: Physician Assistant

## 2016-01-26 VITALS — BP 118/72 | HR 88 | Temp 97.7°F | Resp 16 | Ht 68.0 in | Wt 165.6 lb

## 2016-01-26 DIAGNOSIS — F988 Other specified behavioral and emotional disorders with onset usually occurring in childhood and adolescence: Secondary | ICD-10-CM

## 2016-01-26 DIAGNOSIS — Z23 Encounter for immunization: Secondary | ICD-10-CM

## 2016-01-26 MED ORDER — AMPHETAMINE-DEXTROAMPHETAMINE 20 MG PO TABS: ORAL_TABLET | ORAL | 0 refills | Status: DC

## 2016-01-26 NOTE — Progress Notes (Addendum)
Assessment and Plan: ADD (attention deficit disorder) -  Continue ADD medication, helps with focus, no AE's. The patient was counseled on the addictive nature of the medication and was encouraged to take drug holidays when not needed, doing good job with this.    Future Appointments Date Time Provider [place]  11/16/2016 1:00 PM Megan Salon, MD [place] None    HPI 25 y.o.female presents for 6 month follow up for ADD. She did not do well with wellbutrin, has been on Adderall 20mg  PRN for focus.  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 Does not take daily, mainly takes it for school, does not take it T,T, or weekends.   Past Medical History:  Diagnosis Date  . Abnormal pap 09/08   CIN 1     No Known Allergies    Current Outpatient Prescriptions on File Prior to Visit  Medication Sig Dispense Refill  . norethindrone-ethinyl estradiol (BLISOVI FE 1/20) 1-20 MG-MCG tablet Take 1 tablet by mouth daily. 84 tablet 4   No current facility-administered medications on file prior to visit.     ROS: all negative except above.   Physical Exam: Filed Weights   01/26/16 1045  Weight: 165 lb 9.6 oz (75.1 kg)   BP 118/72   Pulse 88   Temp 97.7 F (36.5 C)   Resp 16   Ht 5\' 8"  (1.727 m)   Wt 165 lb 9.6 oz (75.1 kg)   LMP 01/12/2016   SpO2 98%   BMI 25.18 kg/m  General Appearance: Well developed well nourished, non-toxic appearing in no apparent distress. Eyes: PERRLA, EOMs, conjunctiva w/ no swelling or erythema or discharge Sinuses: No Frontal/maxillary tenderness ENT/Mouth: Ear canals clear without swelling or erythema.  TM's normal bilaterally with no retractions, bulging, or loss of landmarks.   Neck: Supple, thyroid normal, no notable JVD  Respiratory: Respiratory effort normal, Clear breath sounds anteriorly and posteriorly bilaterally without rales, rhonchi, wheezing or stridor. No retractions or accessory muscle  usage. Cardio: RRR with no MRGs.   Abdomen: Soft, + BS.  Non tender, no guarding, rebound, hernias, masses.  Musculoskeletal: Full ROM, 5/5 strength, normal gait.  Skin: Warm, dry without rashes  Neuro: Awake and oriented X 3, Cranial nerves intact. Normal muscle tone, no cerebellar symptoms. Sensation intact.  Psych: normal affect, Insight and Judgment appropriate.     Vicie Mutters, PA-C 11:01 AM [facility]

## 2016-01-27 ENCOUNTER — Encounter: Payer: Self-pay | Admitting: Physician Assistant

## 2016-01-27 DIAGNOSIS — Z803 Family history of malignant neoplasm of breast: Secondary | ICD-10-CM | POA: Insufficient documentation

## 2016-02-28 ENCOUNTER — Other Ambulatory Visit: Payer: Self-pay | Admitting: Physician Assistant

## 2016-02-28 MED ORDER — AMPHETAMINE-DEXTROAMPHETAMINE 20 MG PO TABS: ORAL_TABLET | ORAL | 0 refills | Status: DC

## 2016-04-09 NOTE — L&D Delivery Note (Signed)
Operative Delivery Note-Vacuum / [place]  Patient pushed for 2 hours after she was noted to be C/C/+1.   There was fetal bradycardia and maternal exhaustion.  Discussed with patient operative delivery with a vacuum. Verbal consent: obtained from patient.  Risks and benefits discussed in detail.  Risks include, but are not limited to the risks of anesthesia, bleeding, infection, damage to maternal tissues, fetal cephalhematoma.  There is also the risk of inability to effect vaginal delivery of the head, or shoulder dystocia that cannot be resolved by established maneuvers, leading to the need for emergency cesarean section.  Head determined to be direct LOA. Foley catheter was recently removed so bladder was empty.  Epidural was found to be adequate.  Vertex was +4 station.  The Mity vacuum was placed.  With maternal effort and 2 pulls, a successful outlet vacuum was performed and at 4:22 AM a viable and healthy female was delivered via .  Presentation: vertex; Position: Left,, Occiput,, Anterior; Station: +4.  Anterior; shoulders and body were subsequently delivered. Cord was double clamped and cut and the infant handed to the waiting Pediatricians. The baby was floppy with poor tone initially but with resuscitation noted to have vigorous cry and was noted to be moving all four extremities. Cord gases and cord blood was obtained. The placenta delivered spontaneously intact 3 vessels noted.     Uterine atony was present and despite vigorous massage of the uterus, there was gushes of blood and clots.  A PPH emergency was called out.  A uterine sweep was performed and more large clots were evacuated from the lower uterine segment.  The fundus was firm but there was relaxation of the LUS.  1000 mcg of Cytotec was placed in the rectum, IM Hemabate was administered. The bladder was straight catheterized with a red rubber catheter. Patient remained hemodynamically stable.  The placenta was inspected again and noted to  be intact.  The brisk bleeding resolved and normal post partum lochia was present.   The cervix was inspected and there were no lacerations.  There were no sulcal laceration.  A second degree vaginal laceration was noted and was repaired with 2-0 chromic. The perineum was intact. There was a small left labial tear, not bleeding but re-approximated with 3-0 chromic.  The patient tolerated procedure well.     APGAR: 4, 9; weight  9 pounds 14 oz.   Anesthesia:  Epidural Instruments: Mity Vac  Episiotomy:  None Lacerations:  Second degree vaginal / left labial Suture Repair: 2.0 chromic & 3-0 chromic Est. Blood Loss (mL):  1200 mL  Mom to postpartum.  Baby to Couplet care / Skin to Skin.  Benigno Check, [place] 12/06/2016, 5:13 AM

## 2016-04-11 ENCOUNTER — Encounter: Payer: Self-pay | Admitting: Nurse Practitioner

## 2016-04-11 ENCOUNTER — Ambulatory Visit (INDEPENDENT_AMBULATORY_CARE_PROVIDER_SITE_OTHER): Payer: 59 | Admitting: Nurse Practitioner

## 2016-04-11 ENCOUNTER — Other Ambulatory Visit: Payer: Self-pay | Admitting: Physician Assistant

## 2016-04-11 VITALS — BP 110/78 | HR 92 | Resp 22 | Ht 68.0 in | Wt 167.8 lb

## 2016-04-11 DIAGNOSIS — Z7251 High risk heterosexual behavior: Secondary | ICD-10-CM

## 2016-04-11 LAB — POCT URINE PREGNANCY: Preg Test, Ur: POSITIVE — AB

## 2016-04-11 MED ORDER — AMPHETAMINE-DEXTROAMPHETAMINE 20 MG PO TABS
ORAL_TABLET | ORAL | 0 refills | Status: DC
Start: 2016-04-11 — End: 2016-04-11

## 2016-04-11 NOTE — Progress Notes (Signed)
Future Appointments Date Time Provider [place]  04/11/2016 3:00 PM Kem Boroughs, FNP [place] None  07/30/2016 10:00 AM Vicie Mutters, PA-C [facility] None  11/16/2016 1:00 PM Megan Salon, MD [place] None

## 2016-04-11 NOTE — Progress Notes (Signed)
25 y.o. Single Caucasian female G1P0010 here for follow for amenorrhea.  She had missed some OCP just before Thanksgiving and took a UPT which was negative.  Then this past week took another test that was positive.  She takes continuous active pills and can not remember when her last cycle was since it was so long ago. (chart has listed 07/17/15).  She did stop the OCP last Thursday when she took the test.  She is on prenatal MVI now.  She comes in with a friend who is also pregnant.  The boyfriend of 6 yrs is OK with the news - but they were planning a wedding for the summer.  They live together.   O: Healthy WD,WN female Affect: normal Pelvic exam:UTERUS: consistent with 4 - 6  week size ADNEXA: no masses palpable and non tender  A: early pregnancy - needs confirmation of a viable pregnancy and EDC    P:  Discussed findings of test and need for PUS.  She is to continue with prenatal MVI   Labs PUS tomorrow at 2 pm and see Dr. Sabra Heck    RV

## 2016-04-11 NOTE — Patient Instructions (Signed)

## 2016-04-12 ENCOUNTER — Ambulatory Visit (INDEPENDENT_AMBULATORY_CARE_PROVIDER_SITE_OTHER): Payer: 59 | Admitting: Obstetrics & Gynecology

## 2016-04-12 ENCOUNTER — Ambulatory Visit (INDEPENDENT_AMBULATORY_CARE_PROVIDER_SITE_OTHER): Payer: 59

## 2016-04-12 ENCOUNTER — Encounter: Payer: Self-pay | Admitting: Obstetrics & Gynecology

## 2016-04-12 ENCOUNTER — Other Ambulatory Visit: Payer: Self-pay

## 2016-04-12 VITALS — BP 122/84 | HR 80 | Resp 16 | Ht 68.0 in | Wt 167.0 lb

## 2016-04-12 DIAGNOSIS — N912 Amenorrhea, unspecified: Secondary | ICD-10-CM

## 2016-04-12 DIAGNOSIS — Z3201 Encounter for pregnancy test, result positive: Secondary | ICD-10-CM | POA: Diagnosis not present

## 2016-04-12 DIAGNOSIS — O3680X1 Pregnancy with inconclusive fetal viability, fetus 1: Secondary | ICD-10-CM | POA: Diagnosis not present

## 2016-04-12 NOTE — Progress Notes (Signed)
Reviewed personally.  M. Suzanne Elan Brainerd, MD.  

## 2016-04-12 NOTE — Progress Notes (Signed)
GYNECOLOGY  VISIT   HPI: 26 y.o. G2P0A1 Single Caucasian female with unknown LMP here for viability scan and for dating with her pregnancy.  Pt missed pills around Thanksgiving and then had + UPT the last week of December.  She was seen by Kem Boroughs on 04/11/16 with confirmation of pregnancy.  Exam was normal.  She is having some breast tenderness and fatigue.  Denies nausea.  Denies pain or vaginal bleeding.  Has been with significant other for several years.  She is excited.  They were planning a summer wedding.  Pt reports he is not very excited at this point.     Patient Active Problem List   Diagnosis Date Noted  . Family history of breast cancer in mother 01/27/2016  . ADD (attention deficit disorder) 07/12/2015    Past Medical History:  Diagnosis Date  . 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 No pertinent surgical history.  MEDS:  Reviewed in EPIC and UTD  ALLERGIES: Patient has no known allergies.  Family History  Problem Relation Age of Onset  . Breast cancer Mother 12  . Diabetes Father     SH:  Non smoker, single  Review of Systems  Constitutional: Negative.     PHYSICAL EXAMINATION:    BP 122/84 (BP Location: Left Arm, Patient Position: Sitting, Cuff Size: Normal)   Pulse 80   Resp 16   Ht 5\' 8"  (1.727 m)   Wt 167 lb (75.8 kg)   LMP  (LMP Unknown) Comment: On Junel continuously- unsure LMP  BMI 25.39 kg/m     General appearance: alert, cooperative and appears stated age No other exam performed.  UTERUS: normal gestational sac, yolk sac, and fetal pole noted.  CRL 0.52cm.  EGA 6 1/7 weeks.  FCA at 120BPM noted.  Singleton IUD. ADNEXA: Left ovary:  2.8 x 1.9cm       Right ovary: 2.3 x 1.6cm with 1.5 x 1.0cm corpus luteal cyst noted CUL DE SAC: no free fluid  Discussion:  Findings reviewed.  Dating reviewed with [facility] 12/05/16 discussed.    D/w pt no changing kitty litter as she does have a cat in the home.  Tdap 05/09/2012.  Had chicken pox as  a child.  She did get a flu shot in the fall, maybe October.  She is unsure of the exact dating.   Fish/shellfish/mercury discuss.  Patient does eat and like fish.  She will review recommendations regaridng particular fish.  Unpasteurized cheese/juices discussed.  Nitrites in foods disucssed.  Exercise and intercourse discussed.  Fetal DNA particle testing discussed as well as other first trimester down's testing discussed.  Cystic fibrosis discussed.  She has not decided where is going to transfer care.  Options reviewed.  Assessment: First trimester viable IUP  Plan: Pt will call when she has made new ob appt for transfer of records.  Wished well.   ~25 minutes spent with patient >50% of time was in face to face discussion of above.

## 2016-05-12 ENCOUNTER — Encounter (HOSPITAL_COMMUNITY): Payer: Self-pay | Admitting: *Deleted

## 2016-05-12 ENCOUNTER — Ambulatory Visit (HOSPITAL_COMMUNITY)
Admission: EM | Admit: 2016-05-12 | Discharge: 2016-05-12 | Disposition: A | Discharge status: Home / Self Care | Payer: 59 | Attending: Family Medicine | Admitting: Family Medicine

## 2016-05-12 DIAGNOSIS — J069 Acute upper respiratory infection, unspecified: Secondary | ICD-10-CM | POA: Diagnosis not present

## 2016-05-12 DIAGNOSIS — B9789 Other viral agents as the cause of diseases classified elsewhere: Secondary | ICD-10-CM | POA: Diagnosis not present

## 2016-05-12 MED ORDER — IPRATROPIUM BROMIDE 0.06 % NA SOLN: 2.0000 | Freq: Four times a day (QID) | NASAL | 12 refills | Status: DC

## 2016-05-12 NOTE — Discharge Instructions (Signed)
You most likely have a viral URI, I advise rest, plenty of fluids and management of symptoms with over the counter medicines. For symptoms you may take Tylenol as needed every 4-6 hours for body aches or fever, not to exceed 4,000 mg a day, Take mucinex or mucinex DM ever 12 hours with a full glass of water, you may use an inhaled steroid such as Flonase, 2 sprays each nostril once a day for congestion, or an antihistamine such as Claritin or Zyrtec once a day. I have sent a prescription of ipratropium nasal spray to your pharmacy do 2 sprays each nostril up to 4 times a day.  Should your symptoms worsen or fail to resolve, follow up with your primary care provider or return to clinic.

## 2016-05-12 NOTE — ED Triage Notes (Signed)
C/O congestion, productive cough x 4 days, fever up to 100.5 last night.  Has taken Mucinex.  Pt is [redacted] wks pregnant.

## 2016-05-12 NOTE — ED Provider Notes (Signed)
CSN: [id number]     Arrival date & time 05/12/16  1350 History   First MD Initiated Contact with Patient 05/12/16 1547     Chief Complaint  Patient presents with  . Cough  . Fever   (Consider location/radiation/quality/duration/timing/severity/associated sxs/prior Treatment) 26 year old female presents to clinic with chief complaint of cough, runny nose and fever. She reports a fever of 101.5 at home last night. Cough has been dry and hacking with clear sputum She is [redacted] weeks pregnant and is under OB/GYN care.   The history is provided by the patient.  Cough  Associated symptoms: fever   Fever  Associated symptoms: cough     Past Medical History:  Diagnosis Date  . 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 No pertinent surgical history. Family History  Problem Relation Age of Onset  . Breast cancer Mother 69  . Diabetes Father    Social History  Substance Use Topics  . Smoking status: Never Smoker  . Smokeless tobacco: Never Used  . Alcohol use No   OB History    Gravida Para Term Preterm AB Living   2       1     SAB TAB Ectopic Multiple Live Births     1           Review of Systems  Reason unable to perform ROS: as covered in HPI.  Constitutional: Positive for fever.  Respiratory: Positive for cough.   All other systems reviewed and are negative.   Allergies  Patient has no known allergies.  Home Medications   Prior to Admission medications   Medication Sig Start Date End Date Taking? Authorizing Provider  Prenatal Vit-Fe Fumarate-FA (PRENATAL VITAMIN PO) Take by mouth.   Yes Historical Provider, MD  ipratropium (ATROVENT) 0.06 % nasal spray Place 2 sprays into both nostrils 4 (four) times daily. 05/12/16   Barnet Glasgow, NP   Meds Ordered and Administered this Visit  Medications - No data to display  BP 142/75   Pulse 89   Temp 98.2 F (36.8 C) (Oral)   Resp 16   LMP  (LMP Unknown) Comment: pt is [redacted] wks pregnant per [place]  SpO2 100%  No data  found.   Physical Exam  Constitutional: She is oriented to person, place, and time. She appears well-developed and well-nourished. She does not have a sickly appearance. She does not appear ill. No distress.  HENT:  Head: Normocephalic and atraumatic.  Right Ear: Tympanic membrane and external ear normal.  Left Ear: Tympanic membrane and external ear normal.  Nose: Rhinorrhea present. Right sinus exhibits no maxillary sinus tenderness and no frontal sinus tenderness. Left sinus exhibits no maxillary sinus tenderness and no frontal sinus tenderness.  Mouth/Throat: Uvula is midline and oropharynx is clear and moist. No oropharyngeal exudate.  Eyes: Pupils are equal, round, and reactive to light.  Neck: Normal range of motion. Neck supple. No JVD present.  Cardiovascular: Normal rate and regular rhythm.   Pulmonary/Chest: Effort normal and breath sounds normal. No respiratory distress. She has no wheezes.  Abdominal: Soft. Bowel sounds are normal. She exhibits no distension. There is no tenderness. There is no guarding.  Lymphadenopathy:       Head (right side): No submandibular adenopathy present.       Head (left side): No submandibular and no tonsillar adenopathy present.    She has no cervical adenopathy.  Neurological: She is alert and oriented to person, place,  and time.  Skin: Skin is warm and dry. Capillary refill takes less than 2 seconds. She is not diaphoretic.  Psychiatric: She has a normal mood and affect.  Nursing note and vitals reviewed.   Urgent Care Course     Procedures (including critical care time)  Labs Review Labs Reviewed - No data to display  Imaging Review No results found.   Visual Acuity Review  Right Eye Distance:   Left Eye Distance:   Bilateral Distance:    Right Eye Near:   Left Eye Near:    Bilateral Near:         MDM   1. Viral URI with cough   You most likely have a viral URI, I advise rest, plenty of fluids and management of  symptoms with over the counter medicines. For symptoms you may take Tylenol as needed every 4-6 hours for body aches or fever, not to exceed 4,000 mg a day, Take mucinex or mucinex DM ever 12 hours with a full glass of water, you may use an inhaled steroid such as Flonase, 2 sprays each nostril once a day for congestion, or an antihistamine such as Claritin or Zyrtec once a day. I have sent a prescription of ipratropium nasal spray to your pharmacy do 2 sprays each nostril up to 4 times a day.  Should your symptoms worsen or fail to resolve, follow up with your primary care provider or return to clinic.       Barnet Glasgow, NP 05/12/16 [phone]

## 2016-05-28 DIAGNOSIS — Z3490 Encounter for supervision of normal pregnancy, unspecified, unspecified trimester: Secondary | ICD-10-CM | POA: Insufficient documentation

## 2016-05-28 HISTORY — DX: Encounter for supervision of normal pregnancy, unspecified, unspecified trimester: Z34.90

## 2016-05-29 LAB — OB RESULTS CONSOLE ABO/RH: RH Type: POSITIVE

## 2016-05-29 LAB — OB RESULTS CONSOLE HIV ANTIBODY (ROUTINE TESTING): HIV: NONREACTIVE

## 2016-05-29 LAB — OB RESULTS CONSOLE ANTIBODY SCREEN: ANTIBODY SCREEN: NEGATIVE

## 2016-05-29 LAB — OB RESULTS CONSOLE GC/CHLAMYDIA
CHLAMYDIA, DNA PROBE: NEGATIVE
Gonorrhea: NEGATIVE

## 2016-05-29 LAB — OB RESULTS CONSOLE RPR: RPR: NONREACTIVE

## 2016-05-29 LAB — OB RESULTS CONSOLE RUBELLA ANTIBODY, IGM: RUBELLA: IMMUNE

## 2016-05-29 LAB — OB RESULTS CONSOLE HEPATITIS B SURFACE ANTIGEN: HEP B S AG: NEGATIVE

## 2016-07-30 ENCOUNTER — Encounter: Payer: Self-pay | Admitting: Physician Assistant

## 2016-08-02 ENCOUNTER — Other Ambulatory Visit (HOSPITAL_COMMUNITY): Payer: Self-pay | Admitting: Obstetrics & Gynecology

## 2016-08-02 DIAGNOSIS — Z3689 Encounter for other specified antenatal screening: Secondary | ICD-10-CM

## 2016-08-10 ENCOUNTER — Encounter (HOSPITAL_COMMUNITY): Payer: Self-pay | Admitting: *Deleted

## 2016-08-14 ENCOUNTER — Ambulatory Visit (HOSPITAL_COMMUNITY)
Admission: RE | Admit: 2016-08-14 | Discharge: 2016-08-14 | Disposition: A | Discharge status: Home / Self Care | Payer: Medicaid Other | Source: Ambulatory Visit | Attending: Obstetrics & Gynecology | Admitting: Obstetrics & Gynecology

## 2016-08-14 ENCOUNTER — Encounter (HOSPITAL_COMMUNITY): Payer: Self-pay

## 2016-08-14 ENCOUNTER — Other Ambulatory Visit (HOSPITAL_COMMUNITY): Payer: Self-pay | Admitting: Obstetrics & Gynecology

## 2016-08-14 ENCOUNTER — Ambulatory Visit (HOSPITAL_COMMUNITY): Admission: RE | Admit: 2016-08-14 | Payer: Medicaid Other | Source: Ambulatory Visit

## 2016-08-14 DIAGNOSIS — O4102X0 Oligohydramnios, second trimester, not applicable or unspecified: Secondary | ICD-10-CM

## 2016-08-14 DIAGNOSIS — Z3A23 23 weeks gestation of pregnancy: Secondary | ICD-10-CM | POA: Diagnosis not present

## 2016-08-14 DIAGNOSIS — Z3689 Encounter for other specified antenatal screening: Secondary | ICD-10-CM

## 2016-08-17 ENCOUNTER — Other Ambulatory Visit: Payer: Self-pay

## 2016-08-21 ENCOUNTER — Encounter (HOSPITAL_COMMUNITY): Payer: 59

## 2016-08-21 ENCOUNTER — Ambulatory Visit (HOSPITAL_COMMUNITY): Payer: 59

## 2016-08-27 ENCOUNTER — Other Ambulatory Visit: Payer: Self-pay | Admitting: Obstetrics & Gynecology

## 2016-10-30 LAB — OB RESULTS CONSOLE GBS: GBS: NEGATIVE

## 2016-11-16 ENCOUNTER — Ambulatory Visit: Payer: 59 | Admitting: Obstetrics & Gynecology

## 2016-11-27 ENCOUNTER — Ambulatory Visit (INDEPENDENT_AMBULATORY_CARE_PROVIDER_SITE_OTHER): Payer: Self-pay | Admitting: Pediatrics

## 2016-11-27 DIAGNOSIS — Z7681 Expectant parent(s) prebirth pediatrician visit: Secondary | ICD-10-CM

## 2016-11-29 NOTE — Progress Notes (Signed)
Prenatal counseling for impending newborn done--1st child, 58 wks, no complications, prenatal at 6wks Z47.81

## 2016-11-30 ENCOUNTER — Inpatient Hospital Stay (HOSPITAL_COMMUNITY)
Admission: AD | Admit: 2016-11-30 | Discharge: 2016-11-30 | Disposition: A | Discharge status: Home / Self Care | Payer: Medicaid Other | Source: Ambulatory Visit | Attending: Obstetrics & Gynecology | Admitting: Obstetrics & Gynecology

## 2016-11-30 ENCOUNTER — Encounter (HOSPITAL_COMMUNITY): Payer: Self-pay | Admitting: *Deleted

## 2016-11-30 DIAGNOSIS — Z3A39 39 weeks gestation of pregnancy: Secondary | ICD-10-CM | POA: Diagnosis present

## 2016-11-30 DIAGNOSIS — O163 Unspecified maternal hypertension, third trimester: Secondary | ICD-10-CM | POA: Diagnosis not present

## 2016-11-30 DIAGNOSIS — R030 Elevated blood-pressure reading, without diagnosis of hypertension: Secondary | ICD-10-CM | POA: Insufficient documentation

## 2016-11-30 LAB — CBC
HEMATOCRIT: 38.3 % (ref 36.0–46.0)
HEMOGLOBIN: 13 g/dL (ref 12.0–15.0)
MCH: 29.5 pg (ref 26.0–34.0)
MCHC: 33.9 g/dL (ref 30.0–36.0)
MCV: 87 fL (ref 78.0–100.0)
Platelets: 222 10*3/uL (ref 150–400)
RBC: 4.4 MIL/uL (ref 3.87–5.11)
RDW: 14.2 % (ref 11.5–15.5)
WBC: 9.9 10*3/uL (ref 4.0–10.5)

## 2016-11-30 LAB — COMPREHENSIVE METABOLIC PANEL
ALBUMIN: 3 g/dL — AB (ref 3.5–5.0)
ALK PHOS: 110 U/L (ref 38–126)
ALT: 15 U/L (ref 14–54)
AST: 19 U/L (ref 15–41)
Anion gap: 9 (ref 5–15)
BILIRUBIN TOTAL: 0.5 mg/dL (ref 0.3–1.2)
BUN: 7 mg/dL (ref 6–20)
CO2: 22 mmol/L (ref 22–32)
CREATININE: 0.54 mg/dL (ref 0.44–1.00)
Calcium: 9.6 mg/dL (ref 8.9–10.3)
Chloride: 105 mmol/L (ref 101–111)
GFR calc Af Amer: >60 mL/min (ref >60)
GFR calc non Af Amer: >60 mL/min (ref >60)
GLUCOSE: 99 mg/dL (ref 65–99)
POTASSIUM: 3.7 mmol/L (ref 3.5–5.1)
Sodium: 136 mmol/L (ref 135–145)
TOTAL PROTEIN: 6.6 g/dL (ref 6.5–8.1)

## 2016-11-30 LAB — PROTEIN / CREATININE RATIO, URINE
CREATININE, URINE: 57 mg/dL
PROTEIN CREATININE RATIO: 0.18 mg/mg{creat} — AB (ref 0.00–0.15)
TOTAL PROTEIN, URINE: 10 mg/dL

## 2016-11-30 LAB — URIC ACID: Uric Acid, Serum: 4.5 mg/dL (ref 2.3–6.6)

## 2016-11-30 LAB — LACTATE DEHYDROGENASE: LDH: 157 U/L (ref 98–192)

## 2016-11-30 NOTE — Discharge Instructions (Signed)
Low threshold to admit if symptoms or BP elevated more Follow-up BP check on Monday   Hypertension During Pregnancy Hypertension, commonly called high blood pressure, is when the force of blood pumping through your arteries is too strong. Arteries are blood vessels that carry blood from the heart throughout the body. Hypertension during pregnancy can cause problems for you and your baby. Your baby may be born early (prematurely) or may not weigh as much as he or she should at birth. Very bad cases of hypertension during pregnancy can be life-threatening. Different types of hypertension can occur during pregnancy. These include:  Chronic hypertension. This happens when: ? You have hypertension before pregnancy and it continues during pregnancy. ? You develop hypertension before you are [redacted] weeks pregnant, and it continues during pregnancy.  Gestational hypertension. This is hypertension that develops after the 20th week of pregnancy.  Preeclampsia, also called toxemia of pregnancy. This is a very serious type of hypertension that develops only during pregnancy. It affects the whole body, and it can be very dangerous for you and your baby.  Gestational hypertension and preeclampsia usually go away within 6 weeks after your baby is born. Women who have hypertension during pregnancy have a greater chance of developing hypertension later in life or during future pregnancies. What are the causes? The exact cause of hypertension is not known. What increases the risk? There are certain factors that make it more likely for you to develop hypertension during pregnancy. These include:  Having hypertension during a previous pregnancy or prior to pregnancy.  Being overweight.  Being older than age 39.  Being pregnant for the first time or being pregnant with more than one baby.  Becoming pregnant using fertilization methods such as IVF (in vitro fertilization).  Having diabetes, kidney problems, or  systemic lupus erythematosus.  Having a family history of hypertension.  What are the signs or symptoms? Chronic hypertension and gestational hypertension rarely cause symptoms. Preeclampsia causes symptoms, which may include:  Increased protein in your urine. Your health care provider will check for this at every visit before you give birth (prenatal visit).  Severe headaches.  Sudden weight gain.  Swelling of the hands, face, legs, and feet.  Nausea and vomiting.  Vision problems, such as blurred or double vision.  Numbness in the face, arms, legs, and feet.  Dizziness.  Slurred speech.  Sensitivity to bright lights.  Abdominal pain.  Convulsions.  How is this diagnosed? You may be diagnosed with hypertension during a routine prenatal exam. At each prenatal visit, you may:  Have a urine test to check for high amounts of protein in your urine.  Have your blood pressure checked. A blood pressure reading is recorded as two numbers, such as "120 over 80" (or 120/80). The first ("top") number is called the systolic pressure. It is a measure of the pressure in your arteries when your heart beats. The second ("bottom") number is called the diastolic pressure. It is a measure of the pressure in your arteries as your heart relaxes between beats. Blood pressure is measured in a unit called mm Hg. A normal blood pressure reading is: ? Systolic: below 700. ? Diastolic: below 80.  The type of hypertension that you are diagnosed with depends on your test results and when your symptoms developed.  Chronic hypertension is usually diagnosed before 20 weeks of pregnancy.  Gestational hypertension is usually diagnosed after 20 weeks of pregnancy.  Hypertension with high amounts of protein in the urine is  diagnosed as preeclampsia.  Blood pressure measurements that stay above 563 systolic, or above 893 diastolic, are signs of severe preeclampsia.  How is this treated? Treatment for  hypertension during pregnancy varies depending on the type of hypertension you have and how serious it is.  If you take medicines called ACE inhibitors to treat chronic hypertension, you may need to switch medicines. ACE inhibitors should not be taken during pregnancy.  If you have gestational hypertension, you may need to take blood pressure medicine.  If you are at risk for preeclampsia, your health care provider may recommend that you take a low-dose aspirin every day to prevent high blood pressure during your pregnancy.  If you have severe preeclampsia, you may need to be hospitalized so you and your baby can be monitored closely. You may also need to take medicine (magnesium sulfate) to prevent seizures and to lower blood pressure. This medicine may be given as an injection or through an IV tube.  In some cases, if your condition gets worse, you may need to deliver your baby early.  Follow these instructions at home: Eating and drinking  Drink enough fluid to keep your urine clear or pale yellow.  Eat a healthy diet that is low in salt (sodium). Do not add salt to your food. Check food labels to see how much sodium a food or beverage contains. Lifestyle  Do not use any products that contain nicotine or tobacco, such as cigarettes and e-cigarettes. If you need help quitting, ask your health care provider.  Do not use alcohol.  Avoid caffeine.  Avoid stress as much as possible. Rest and get plenty of sleep. General instructions  Take over-the-counter and prescription medicines only as told by your health care provider.  While lying down, lie on your left side. This keeps pressure off your baby.  While sitting or lying down, raise (elevate) your feet. Try putting some pillows under your lower legs.  Exercise regularly. Ask your health care provider what kinds of exercise are best for you.  Keep all prenatal and follow-up visits as told by your health care provider. This is  important. Contact a health care provider if:  You have symptoms that your health care provider told you may require more treatment or monitoring, such as: ? Fever. ? Vomiting. ? Headache. Get help right away if:  You have severe abdominal pain or vomiting that does not get better with treatment.  You suddenly develop swelling in your hands, ankles, or face.  You gain 4 lbs (1.8 kg) or more in 1 week.  You develop vaginal bleeding, or you have blood in your urine.  You do not feel your baby moving as much as usual.  You have blurred or double vision.  You have muscle twitching or sudden tightening (spasms).  You have shortness of breath.  Your lips or fingernails turn blue. This information is not intended to replace advice given to you by your health care provider. Make sure you discuss any questions you have with your health care provider. Document Released: 12/12/2010 Document Revised: 10/14/2015 Document Reviewed: 09/09/2015 Elsevier Interactive Patient Education  [employer].

## 2016-11-30 NOTE — MAU Note (Signed)
Pt sent from office for elevated b/p's.Denies Headache reports she did she some spots when she was in the office earlier today.Good fetal movement reported.

## 2016-11-30 NOTE — MAU Provider Note (Signed)
Chief Complaint:  Hypertension   First Provider Initiated Contact with Patient 11/30/16 1700      HPI: Deborah Simpson is a 26 y.o. G2P0010 at [redacted]w[redacted]d who presents to MAU from office with elevated BPs. Patient unable to report how elevated pressures were in office. She denies HA, RUQ abdominal pain, vision changes, peripheral edema. She has had some increase swelling only in the right hand with associated locking of her finger.   Denies contractions, leakage of fluid, vaginal discharge, or vaginal bleeding. Good fetal movement.   Pregnancy Course:  Receives prenatal care at [facility]  Past Medical History: Past Medical History:  Diagnosis Date  . 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 NO PAST SURGERIES       Family History: Family History  Problem Relation Age of Onset  . Breast cancer Mother 38  . Diabetes Father     Social History: Social History  Substance Use Topics  . Smoking status: Never Smoker  . Smokeless tobacco: Never Used  . Alcohol use No    Allergies: No Known Allergies  Meds:  Prescriptions Prior to Admission  Medication Sig Dispense Refill Last Dose  . CALCIUM PO Take 1 tablet by mouth daily.    11/29/2016 at Unknown time  . Prenatal Vit-Fe Fumarate-FA (PRENATAL VITAMIN PO) Take 1 tablet by mouth daily.    11/29/2016 at Unknown time  . ipratropium (ATROVENT) 0.06 % nasal spray Place 2 sprays into both nostrils 4 (four) times daily. (Patient not taking: Reported on 08/14/2016) 15 mL 12 Not Taking    I have reviewed patient's Past Medical Hx, Surgical Hx, Family Hx, Social Hx, medications and allergies.   ROS:  All systems reviewed  and are negative for acute change except as noted in the HPI.   Physical Exam  Patient Vitals for the past 24 hrs:  BP Temp Pulse Resp  11/30/16 1631 129/80 - (!) 105 -  11/30/16 1616 129/84 - (!) 105 -  11/30/16 1602 (!) 128/53 - (!) 109 -  11/30/16 1546 137/88 - (!) 108 -  11/30/16 1542 125/83 97.7 F (36.5 C) (!) 109 18  11/30/16 1538 133/80 - (!) 108 -   Constitutional: Well-developed, well-nourished female in no acute distress.  Cardiovascular: normal rate Respiratory: normal effort GI: Abd soft, non-tender, gravid appropriate for gestational age. Pos BS x 4 MS: Extremities nontender, mild edema to right hand, normal ROM Neurologic: Alert and oriented x 4.  GU: Neg CVAT. Psych: appropriate mood and affect    Labs: Results for orders placed or performed during the hospital encounter of 11/30/16 (from the past 24 hour(s))  Protein / creatinine ratio, urine     Status: Abnormal   Collection Time: 11/30/16  3:30 PM  Result Value Ref Range   Creatinine, Urine 57.00 mg/dL   Total Protein, Urine 10 mg/dL   Protein Creatinine Ratio 0.18 (H) 0.00 - 0.15 mg/mg[Cre]  CBC     Status: None   Collection Time: 11/30/16  4:08 PM  Result Value Ref Range   WBC 9.9 4.0 - 10.5 K/uL   RBC 4.40 3.87 - 5.11 MIL/uL   Hemoglobin 13.0 12.0 - 15.0 g/dL   HCT 38.3 36.0 - 46.0 %   MCV 87.0 78.0 - 100.0 fL   MCH 29.5 26.0 - 34.0 pg   MCHC 33.9 30.0 - 36.0 g/dL   RDW 14.2 11.5 - 15.5 %   Platelets 222 150 - 400 K/uL  Comprehensive metabolic panel     Status: Abnormal   Collection Time: 11/30/16  4:08 PM  Result Value Ref Range   Sodium 136 135 - 145 mmol/L   Potassium 3.7 3.5 - 5.1 mmol/L   Chloride 105 101 - 111 mmol/L   CO2 22 22 - 32 mmol/L   Glucose, Bld 99 65 - 99 mg/dL   BUN 7 6 - 20 mg/dL   Creatinine, Ser 0.54 0.44 - 1.00 mg/dL   Calcium 9.6 8.9 - 10.3 mg/dL   Total Protein 6.6 6.5 - 8.1 g/dL   Albumin 3.0 (L) 3.5 - 5.0 g/dL   AST 19 15 - 41 U/L   ALT 15 14 - 54 U/L    Alkaline Phosphatase 110 38 - 126 U/L   Total Bilirubin 0.5 0.3 - 1.2 mg/dL   GFR calc non Af Amer >60 >60 mL/min   GFR calc Af Amer >60 >60 mL/min   Anion gap 9 5 - 15  Lactate dehydrogenase     Status: None   Collection Time: 11/30/16  4:08 PM  Result Value Ref Range   LDH 157 98 - 192 U/L  Uric acid     Status: None   Collection Time: 11/30/16  4:08 PM  Result Value Ref Range   Uric Acid, Serum 4.5 2.3 - 6.6 mg/dL    Imaging:  No results found.  MAU Course: PreE labs normal asymptomatic Vitals and nurse notes reviewed No elevated or severe range BPs while in MAU  I personally reviewed the patient's NST today, found to be REACTIVE. 130 bpm, mod var, +accels, no decels. CTX: irregular.   MDM: Plan of care reviewed with patient, including labs and tests ordered and medical treatment.   Assessment: 1. Elevated blood pressure affecting pregnancy in third trimester, antepartum    R/o preeclampsia. May have new onset gHTN but not able to diagnoses at this time.   Plan: Low threshold to admit and induce if worsens Follow-up BP check on Monday Discharge home in stable condition.  Labor precautions and fetal kick counts Handout given Follow-up with OB provider   Luiz Blare, DO OB Fellow [facility] for [facility], [facility] 11/30/2016 5:00 PM

## 2016-12-04 ENCOUNTER — Telehealth (HOSPITAL_COMMUNITY): Payer: Self-pay | Admitting: *Deleted

## 2016-12-04 ENCOUNTER — Other Ambulatory Visit: Payer: Self-pay | Admitting: Obstetrics & Gynecology

## 2016-12-04 NOTE — Telephone Encounter (Signed)
Preadmission screen  

## 2016-12-05 ENCOUNTER — Inpatient Hospital Stay (HOSPITAL_COMMUNITY): Payer: Medicaid Other | Admitting: Anesthesiology

## 2016-12-05 ENCOUNTER — Inpatient Hospital Stay (HOSPITAL_COMMUNITY)
Admission: AD | Admit: 2016-12-05 | Discharge: 2016-12-08 | DRG: 774 | Disposition: A | Discharge status: Home / Self Care | Payer: Medicaid Other | Source: Ambulatory Visit | Attending: Obstetrics & Gynecology | Admitting: Obstetrics & Gynecology

## 2016-12-05 ENCOUNTER — Encounter (HOSPITAL_COMMUNITY): Payer: Self-pay

## 2016-12-05 DIAGNOSIS — O134 Gestational [pregnancy-induced] hypertension without significant proteinuria, complicating childbirth: Principal | ICD-10-CM | POA: Diagnosis present

## 2016-12-05 DIAGNOSIS — Z3A40 40 weeks gestation of pregnancy: Secondary | ICD-10-CM

## 2016-12-05 DIAGNOSIS — O480 Post-term pregnancy: Secondary | ICD-10-CM | POA: Diagnosis present

## 2016-12-05 LAB — CBC
HEMATOCRIT: 38.2 % (ref 36.0–46.0)
HEMOGLOBIN: 13.3 g/dL (ref 12.0–15.0)
MCH: 30.1 pg (ref 26.0–34.0)
MCHC: 34.8 g/dL (ref 30.0–36.0)
MCV: 86.4 fL (ref 78.0–100.0)
Platelets: 202 10*3/uL (ref 150–400)
RBC: 4.42 MIL/uL (ref 3.87–5.11)
RDW: 14.1 % (ref 11.5–15.5)
WBC: 10.7 10*3/uL — AB (ref 4.0–10.5)

## 2016-12-05 LAB — TYPE AND SCREEN
ABO/RH(D): O POS
ANTIBODY SCREEN: NEGATIVE

## 2016-12-05 LAB — ABO/RH: ABO/RH(D): O POS

## 2016-12-05 MED ORDER — LACTATED RINGERS IV SOLN: 500.0000 mL | INTRAVENOUS | Status: DC | PRN

## 2016-12-05 MED ORDER — OXYTOCIN BOLUS FROM INFUSION
500.0000 mL | Freq: Once | INTRAVENOUS | Status: AC
  Administered 2016-12-06: 500 mL via INTRAVENOUS

## 2016-12-05 MED ORDER — FENTANYL 2.5 MCG/ML BUPIVACAINE 1/10 % EPIDURAL INFUSION (WH - ANES)
14.0000 mL/h | INTRAMUSCULAR | Status: DC | PRN
  Administered 2016-12-05 – 2016-12-06 (×2): 14 mL/h via EPIDURAL
  Filled 2016-12-05 (×2): qty 100

## 2016-12-05 MED ORDER — TERBUTALINE SULFATE 1 MG/ML IJ SOLN: 0.2500 mg | Freq: Once | INTRAMUSCULAR | Status: DC | PRN

## 2016-12-05 MED ORDER — MISOPROSTOL 25 MCG QUARTER TABLET
25.0000 ug | ORAL_TABLET | ORAL | Status: DC | PRN
Start: 2016-12-05 — End: 2016-12-06

## 2016-12-05 MED ORDER — EPHEDRINE 5 MG/ML INJ
10.0000 mg | INTRAVENOUS | Status: DC | PRN
  Filled 2016-12-05: qty 2

## 2016-12-05 MED ORDER — PHENYLEPHRINE 40 MCG/ML (10ML) SYRINGE FOR IV PUSH (FOR BLOOD PRESSURE SUPPORT)
80.0000 ug | PREFILLED_SYRINGE | INTRAVENOUS | Status: DC | PRN
  Filled 2016-12-05: qty 5

## 2016-12-05 MED ORDER — PHENYLEPHRINE 40 MCG/ML (10ML) SYRINGE FOR IV PUSH (FOR BLOOD PRESSURE SUPPORT): 80.0000 ug | PREFILLED_SYRINGE | INTRAVENOUS | Status: DC | PRN

## 2016-12-05 MED ORDER — SOD CITRATE-CITRIC ACID 500-334 MG/5ML PO SOLN: 30.0000 mL | ORAL | Status: DC | PRN

## 2016-12-05 MED ORDER — LIDOCAINE HCL (PF) 1 % IJ SOLN
INTRAMUSCULAR | Status: DC | PRN
  Administered 2016-12-05: 5 mL
  Administered 2016-12-05: 2 mL
  Administered 2016-12-05: 3 mL

## 2016-12-05 MED ORDER — LACTATED RINGERS IV SOLN: 500.0000 mL | Freq: Once | INTRAVENOUS | Status: DC

## 2016-12-05 MED ORDER — ACETAMINOPHEN 325 MG PO TABS
650.0000 mg | ORAL_TABLET | ORAL | Status: DC | PRN
  Filled 2016-12-05 (×3): qty 2

## 2016-12-05 MED ORDER — PHENYLEPHRINE 40 MCG/ML (10ML) SYRINGE FOR IV PUSH (FOR BLOOD PRESSURE SUPPORT)
80.0000 ug | PREFILLED_SYRINGE | INTRAVENOUS | Status: DC | PRN
  Filled 2016-12-05: qty 10

## 2016-12-05 MED ORDER — LIDOCAINE HCL (PF) 1 % IJ SOLN
30.0000 mL | INTRAMUSCULAR | Status: DC | PRN
  Filled 2016-12-05: qty 30

## 2016-12-05 MED ORDER — LACTATED RINGERS IV SOLN
500.0000 mL | Freq: Once | INTRAVENOUS | Status: AC
  Administered 2016-12-05: 500 mL via INTRAVENOUS

## 2016-12-05 MED ORDER — OXYTOCIN 40 UNITS IN LACTATED RINGERS INFUSION - SIMPLE MED: 2.5000 [IU]/h | INTRAVENOUS | Status: DC

## 2016-12-05 MED ORDER — ONDANSETRON HCL 4 MG/2ML IJ SOLN
4.0000 mg | Freq: Four times a day (QID) | INTRAMUSCULAR | Status: DC | PRN
  Administered 2016-12-06: 4 mg via INTRAVENOUS
  Filled 2016-12-05: qty 2

## 2016-12-05 MED ORDER — EPHEDRINE 5 MG/ML INJ: 10.0000 mg | INTRAVENOUS | Status: DC | PRN

## 2016-12-05 MED ORDER — DIPHENHYDRAMINE HCL 50 MG/ML IJ SOLN: 12.5000 mg | INTRAMUSCULAR | Status: DC | PRN

## 2016-12-05 MED ORDER — LACTATED RINGERS IV SOLN
INTRAVENOUS | Status: DC
  Administered 2016-12-05 – 2016-12-06 (×4): via INTRAVENOUS

## 2016-12-05 MED ORDER — OXYTOCIN 40 UNITS IN LACTATED RINGERS INFUSION - SIMPLE MED
1.0000 m[IU]/min | INTRAVENOUS | Status: DC
  Administered 2016-12-05: 2 m[IU]/min via INTRAVENOUS
  Filled 2016-12-05: qty 1000

## 2016-12-05 NOTE — H&P (Signed)
Deborah Simpson is a 26 y.o. female G2P0010 at 71 weeks presenting for IOL for gestational HTN. BP today In office elevated 140 / 90.  Patient also had a recent diastolic BP of 94 in the office.  She denies HA, no blurry vision, no RUQ pain, no vaginal bleeding. She reports good fetal movements.    OB History    Gravida Para Term Preterm AB Living   2       1     SAB TAB Ectopic Multiple Live Births     1           Past Medical History:  Diagnosis Date  . 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 NO PAST SURGERIES     Family History: family history includes Breast cancer (age of onset: 56) in her mother; Diabetes in her father. Social History:  reports that she has never smoked. She has never used smokeless tobacco. She reports that she does not drink alcohol or use drugs.     Maternal Diabetes: No Genetic Screening: Normal Maternal Ultrasounds/Referrals: Normal Fetal Ultrasounds or other Referrals:  None Maternal Substance Abuse:  No Significant Maternal Medications:  None Significant Maternal Lab Results:  Lab values include: Group B Strep negative Other Comments:  None  ROS as per HPI History   Blood pressure 125/83, pulse (!) 118, temperature 97.6 F (36.4 C), temperature source Oral, resp. rate 16, height 5\' 8"  (1.727 m), weight 93 kg (205 lb). Exam Physical Exam  Cervical exam from office 2cm / 60/-2  Prenatal labs: ABO, Rh: O/Positive/-- (02/20 0000) Antibody: Negative (02/20 0000) Rubella: Immune (02/20 0000) RPR: Nonreactive (02/20 0000)  HBsAg: Negative (02/20 0000)  HIV: Non-reactive (02/20 0000)  GBS: Negative (07/24 0000)   Assessment/Plan: 26 yo G2P0010 at 14 weeks with new dx of gestational hypertension Admit to L&D Continuous monitoring Pitocin for induction  Epidural on demand Deborah Simpson, [place] 12/05/2016, 1:27 PM

## 2016-12-05 NOTE — Progress Notes (Signed)
Deborah Simpson is a 26 y.o. G2P0010 at [redacted]w[redacted]d by LMP admitted for induction of labor due to gestational hypertension.  Subjective: Patient comfortable not in pain  Objective: BP 114/67   Pulse 94   Temp 99.3 F (37.4 C) (Oral)   Resp 16   Ht 5\' 8"  (1.727 m)   Wt 93 kg (205 lb)   LMP  (LMP Unknown) Comment: pt is [redacted] wks pregnant per [place]  BMI 31.17 kg/m  No intake/output data recorded. No intake/output data recorded.  FHT:  FHR: 135 bpm, variability: moderate,  accelerations:  Present,  decelerations:  Absent UC:   regular, every 2 minutes SVE:   Dilation: 3 Effacement (%): 60, 70 Station: -2 Exam by:: Dr. Alwyn Pea  IUPC placed   Labs: Lab Results  Component Value Date   WBC 10.7 (H) 12/05/2016   HGB 13.3 12/05/2016   HCT 38.2 12/05/2016   MCV 86.4 12/05/2016   PLT 202 12/05/2016    Assessment / Plan: Induction of labor due to gestational hypertension,  Still in Latent labor  Labor: Latent labor Preeclampsia:  no signs or symptoms of toxicity Fetal Wellbeing:  Category I Pain Control:  Labor support without medications I/D:  n/a Anticipated MOD:  NSVD  Modestine Scherzinger, [place] 12/05/2016, 8:07 PM

## 2016-12-05 NOTE — Anesthesia Preprocedure Evaluation (Signed)
Anesthesia Evaluation  Patient identified by MRN, date of birth, ID band Patient awake    Reviewed: Allergy & Precautions, Patient's Chart, lab work & pertinent test results  Airway Mallampati: II       Dental   Pulmonary neg pulmonary ROS,    Pulmonary exam normal        Cardiovascular hypertension (IOL 2/2 GHTN), Normal cardiovascular exam     Neuro/Psych negative neurological ROS     GI/Hepatic negative GI ROS, Neg liver ROS,   Endo/Other  negative endocrine ROS  Renal/GU negative Renal ROS     Musculoskeletal   Abdominal   Peds  Hematology negative hematology ROS (+)   Anesthesia Other Findings   Reproductive/Obstetrics (+) Pregnancy                             Lab Results  Component Value Date   WBC 10.7 (H) 12/05/2016   HGB 13.3 12/05/2016   HCT 38.2 12/05/2016   MCV 86.4 12/05/2016   PLT 202 12/05/2016    Anesthesia Physical Anesthesia Plan  ASA: II  Anesthesia Plan: Epidural   Post-op Pain Management:    Induction:   PONV Risk Score and Plan: Treatment may vary due to age or medical condition  Airway Management Planned: Natural Airway  Additional Equipment:   Intra-op Plan:   Post-operative Plan:   Informed Consent: I have reviewed the patients History and Physical, chart, labs and discussed the procedure including the risks, benefits and alternatives for the proposed anesthesia with the patient or authorized representative who has indicated his/her understanding and acceptance.     Plan Discussed with:   Anesthesia Plan Comments:         Anesthesia Quick Evaluation

## 2016-12-05 NOTE — Anesthesia Procedure Notes (Signed)
Epidural Patient location during procedure: OB Start time: 12/05/2016 8:23 PM End time: 12/05/2016 8:30 PM  Staffing Anesthesiologist: Suzette Battiest Performed: anesthesiologist   Preanesthetic Checklist Completed: patient identified, site marked, surgical consent, pre-op evaluation, timeout performed, IV checked, risks and benefits discussed and monitors and equipment checked  Epidural Patient position: sitting Prep: site prepped and draped and DuraPrep Patient monitoring: continuous pulse ox and blood pressure Approach: midline Location: L4-L5 Injection technique: LOR air  Needle:  Needle type: Tuohy  Needle gauge: 17 G Needle length: 9 cm and 9 Needle insertion depth: 6 cm Catheter type: closed end flexible Catheter size: 19 Gauge Catheter at skin depth: 12 (11-->12cm when laid in lat decub position.) cm Test dose: negative  Assessment Events: blood not aspirated, injection not painful, no injection resistance, negative IV test and no paresthesia

## 2016-12-05 NOTE — Anesthesia Pain Management Evaluation Note (Signed)
  CRNA Pain Management Visit Note  Patient: Deborah Simpson, 26 y.o., female  "Hello I am a member of the anesthesia team at [facility]. We have an anesthesia team available at all times to provide care throughout the hospital, including epidural management and anesthesia for C-section. I don't know your plan for the delivery whether it a natural birth, water birth, IV sedation, nitrous supplementation, doula or epidural, but we want to meet your pain goals."   1.Was your pain managed to your expectations on prior hospitalizations?   No prior hospitalizations  2.What is your expectation for pain management during this hospitalization?     Epidural  3.How can we help you reach that goal? epidural  Record the patient's initial score and the patient's pain goal.   Pain: 0  Pain Goal: 6 [facility] wants you to be able to say your pain was always managed very well.  [name] 12/05/2016

## 2016-12-06 ENCOUNTER — Encounter (HOSPITAL_COMMUNITY): Payer: Self-pay | Admitting: *Deleted

## 2016-12-06 LAB — CBC
HCT: 34 % — ABNORMAL LOW (ref 36.0–46.0)
HEMOGLOBIN: 11.7 g/dL — AB (ref 12.0–15.0)
MCH: 30 pg (ref 26.0–34.0)
MCHC: 34.4 g/dL (ref 30.0–36.0)
MCV: 87.2 fL (ref 78.0–100.0)
PLATELETS: 210 10*3/uL (ref 150–400)
RBC: 3.9 MIL/uL (ref 3.87–5.11)
RDW: 14.3 % (ref 11.5–15.5)
WBC: 25.3 10*3/uL — AB (ref 4.0–10.5)

## 2016-12-06 LAB — RPR: RPR: NONREACTIVE

## 2016-12-06 MED ORDER — CARBOPROST TROMETHAMINE 250 MCG/ML IM SOLN
250.0000 ug | INTRAMUSCULAR | Status: DC | PRN
  Administered 2016-12-06: 250 ug via INTRAMUSCULAR

## 2016-12-06 MED ORDER — ZOLPIDEM TARTRATE 5 MG PO TABS: 5.0000 mg | ORAL_TABLET | Freq: Every evening | ORAL | Status: DC | PRN

## 2016-12-06 MED ORDER — SIMETHICONE 80 MG PO CHEW
80.0000 mg | CHEWABLE_TABLET | ORAL | Status: DC | PRN
  Filled 2016-12-06: qty 1

## 2016-12-06 MED ORDER — SODIUM CHLORIDE 0.9 % IV SOLN
3.0000 g | Freq: Once | INTRAVENOUS | Status: AC
  Administered 2016-12-06: 3 g via INTRAVENOUS
  Filled 2016-12-06: qty 3

## 2016-12-06 MED ORDER — MISOPROSTOL 200 MCG PO TABS
ORAL_TABLET | ORAL | Status: AC
  Filled 2016-12-06: qty 5

## 2016-12-06 MED ORDER — CARBOPROST TROMETHAMINE 250 MCG/ML IM SOLN
250.0000 ug | Freq: Once | INTRAMUSCULAR | Status: DC
  Filled 2016-12-06: qty 1

## 2016-12-06 MED ORDER — MISOPROSTOL 200 MCG PO TABS
1000.0000 ug | ORAL_TABLET | Freq: Once | ORAL | Status: AC
  Administered 2016-12-06: 1000 ug via RECTAL

## 2016-12-06 MED ORDER — WITCH HAZEL-GLYCERIN EX PADS: 1.0000 "application " | MEDICATED_PAD | CUTANEOUS | Status: DC | PRN

## 2016-12-06 MED ORDER — PRENATAL MULTIVITAMIN CH
1.0000 | ORAL_TABLET | Freq: Every day | ORAL | Status: DC
  Administered 2016-12-06 – 2016-12-08 (×3): 1 via ORAL
  Filled 2016-12-06 (×3): qty 1

## 2016-12-06 MED ORDER — IBUPROFEN 600 MG PO TABS
600.0000 mg | ORAL_TABLET | Freq: Four times a day (QID) | ORAL | Status: DC
  Administered 2016-12-06 – 2016-12-08 (×8): 600 mg via ORAL
  Filled 2016-12-06 (×9): qty 1

## 2016-12-06 MED ORDER — ONDANSETRON HCL 4 MG PO TABS: 4.0000 mg | ORAL_TABLET | ORAL | Status: DC | PRN

## 2016-12-06 MED ORDER — BENZOCAINE-MENTHOL 20-0.5 % EX AERO
1.0000 "application " | INHALATION_SPRAY | CUTANEOUS | Status: DC | PRN
  Administered 2016-12-06: 1 via TOPICAL
  Filled 2016-12-06 (×2): qty 56

## 2016-12-06 MED ORDER — OXYCODONE-ACETAMINOPHEN 5-325 MG PO TABS: 1.0000 | ORAL_TABLET | ORAL | Status: DC | PRN

## 2016-12-06 MED ORDER — MISOPROSTOL 200 MCG PO TABS: 800.0000 ug | ORAL_TABLET | Freq: Once | ORAL | Status: DC

## 2016-12-06 MED ORDER — ONDANSETRON HCL 4 MG/2ML IJ SOLN: 4.0000 mg | INTRAMUSCULAR | Status: DC | PRN

## 2016-12-06 MED ORDER — DIPHENOXYLATE-ATROPINE 2.5-0.025 MG PO TABS
2.0000 | ORAL_TABLET | Freq: Four times a day (QID) | ORAL | Status: DC | PRN
  Administered 2016-12-06: 2 via ORAL
  Filled 2016-12-06: qty 2

## 2016-12-06 MED ORDER — OXYCODONE-ACETAMINOPHEN 5-325 MG PO TABS: 2.0000 | ORAL_TABLET | ORAL | Status: DC | PRN

## 2016-12-06 MED ORDER — DIBUCAINE 1 % RE OINT: 1.0000 "application " | TOPICAL_OINTMENT | RECTAL | Status: DC | PRN

## 2016-12-06 MED ORDER — ACETAMINOPHEN 325 MG PO TABS
650.0000 mg | ORAL_TABLET | ORAL | Status: DC | PRN
  Administered 2016-12-07 – 2016-12-08 (×3): 650 mg via ORAL

## 2016-12-06 MED ORDER — OXYTOCIN 40 UNITS IN LACTATED RINGERS INFUSION - SIMPLE MED: 2.5000 [IU]/h | INTRAVENOUS | Status: DC | PRN

## 2016-12-06 MED ORDER — DIPHENHYDRAMINE HCL 25 MG PO CAPS: 25.0000 mg | ORAL_CAPSULE | Freq: Four times a day (QID) | ORAL | Status: DC | PRN

## 2016-12-06 MED ORDER — COCONUT OIL OIL
1.0000 "application " | TOPICAL_OIL | Status: DC | PRN
  Administered 2016-12-07: 1 via TOPICAL
  Filled 2016-12-06: qty 120

## 2016-12-06 MED ORDER — TETANUS-DIPHTH-ACELL PERTUSSIS 5-2.5-18.5 LF-MCG/0.5 IM SUSP: 0.5000 mL | Freq: Once | INTRAMUSCULAR | Status: DC

## 2016-12-06 MED ORDER — SENNOSIDES-DOCUSATE SODIUM 8.6-50 MG PO TABS
2.0000 | ORAL_TABLET | ORAL | Status: DC
  Filled 2016-12-06: qty 2

## 2016-12-06 NOTE — Progress Notes (Signed)
Deborah Simpson is a 26 y.o. G2P0010 at [redacted]w[redacted]d by LMP admitted for induction of labor due to Hypertension.  Subjective: Patient feels rectal pressure  Objective: BP 129/81   Pulse (!) 106   Temp 99.1 F (37.3 C) (Oral)   Resp 18   Ht 5\' 8"  (1.727 m)   Wt 93 kg (205 lb)   LMP  (LMP Unknown) Comment: pt is [redacted] wks pregnant per [place]  SpO2 100%   BMI 31.17 kg/m  No intake/output data recorded. No intake/output data recorded.  FHT:  FHR: 125 bpm, variability: moderate,  accelerations:  Present,  decelerations:  Present variable decelerations with ctx UC:   regular, every 2 minutes SVE:   Dilation: 10 Effacement (%): 100 Station: +1 Exam by:: Deborah Felling, RN  Labs: Lab Results  Component Value Date   WBC 10.7 (H) 12/05/2016   HGB 13.3 12/05/2016   HCT 38.2 12/05/2016   MCV 86.4 12/05/2016   PLT 202 12/05/2016    Assessment / Plan: Active labor now in Second Stage  Labor: Progressing normally Preeclampsia:  no signs or symptoms of toxicity Fetal Wellbeing:  Category II Pain Control:  Epidural I/D:  n/a Anticipated MOD:  NSVD  Deborah Simpson Deborah Simpson 12/06/2016, 3:33 AM

## 2016-12-06 NOTE — Anesthesia Postprocedure Evaluation (Signed)
Anesthesia Post Note  Patient: Deborah Simpson  Procedure(s) Performed: * No procedures listed *     Patient location during evaluation: Mother Baby Anesthesia Type: Epidural Level of consciousness: awake Pain management: pain level controlled Vital Signs Assessment: post-procedure vital signs reviewed and stable Respiratory status: spontaneous breathing Cardiovascular status: stable Postop Assessment: no headache, no backache, epidural receding, patient able to bend at knees, no signs of nausea or vomiting and adequate PO intake Anesthetic complications: no    Last Vitals:  Vitals:   12/06/16 0824 12/06/16 1230  BP: 129/84 115/76  Pulse: 88 95  Resp: 18 18  Temp: 36.9 C 36.9 C  SpO2: 96% 96%    Last Pain:  Vitals:   12/06/16 1230  TempSrc: Oral  PainSc: 2    Pain Goal: Patients Stated Pain Goal: 3 (12/06/16 1230)               [name]

## 2016-12-06 NOTE — Lactation Note (Signed)
This note was copied from a baby's chart. Lactation Consultation Note  Patient Name: Girl Victora Irby [id number] Date: 12/06/2016 Reason for consult: Initial assessment;1st time breastfeeding  Initial visit at 9 hours of age.  Mom reports baby had a good feeding about 1 hour ago for 10 minutes of active sucking.  Mom reports knowing how to work on hand expression and places baby STS with feedings.  Mom denies pain with latching.   Chart indicates 1200 EBL.  LC discussed casually with mom as she has visitors in the room.  LC explained blood loss can affect how quickly and adequately milk supply increases in volume. Mom reports being told about spoon feeding by Rn. LC encouraged mom to work on hand expression prior to latching baby, during feedings and after to apply to nipples.  LC encouraged mom to work on spoon feeding after feedings and call for assist as needed. [facility] LC resources given and discussed.  Encouraged to feed with early cues on demand 8-12x/day. Early newborn behavior discussed. Mom asking about foods she can eat during lactating, mom denies other concerns at thsi time.   Mom to call for assist as needed.     Maternal Data Has patient been taught Hand Expression?: Yes  Feeding Feeding Type: Breast Fed Length of feed: 10 min  LATCH Score                   Interventions Interventions: Breast feeding basics reviewed  Lactation Tools Discussed/Used WIC Program: Yes   Consult Status Consult Status: Follow-up Date: 12/07/16 Follow-up type: In-patient    Daionna Crossland 12/06/2016, 1:40 PM

## 2016-12-07 LAB — BIRTH TISSUE RECOVERY COLLECTION (PLACENTA DONATION)

## 2016-12-07 NOTE — Progress Notes (Signed)
Patient is eating, ambulating, voiding.  Pain control is good.  Appropriate lochia, no complaints.  Vitals:   12/06/16 0700 12/06/16 0824 12/06/16 1230 12/07/16 0615  BP: 131/78 129/84 115/76 119/85  Pulse: 90 88 95 91  Resp: 20 18 18 16   Temp: 98.2 F (36.8 C) 98.4 F (36.9 C) 98.5 F (36.9 C) 98.2 F (36.8 C)  TempSrc: Oral Oral Oral   SpO2: 99% 96% 96%   Weight:      Height:        Fundus firm Perineum without swelling. Ext: no Ct  Lab Results  Component Value Date   WBC 25.3 (H) 12/06/2016   HGB 11.7 (L) 12/06/2016   HCT 34.0 (L) 12/06/2016   MCV 87.2 12/06/2016   PLT 210 12/06/2016    --/--/O POS, O POS (08/29 1310)  A/P Post partum day 1  Routine care.  Expect d/c 9/1.    Allyn Kenner

## 2016-12-07 NOTE — Lactation Note (Signed)
This note was copied from a baby's chart. Lactation Consultation Note  Patient Name: Deborah Simpson [id number] Date: 12/07/2016 Reason for consult: Follow-up assessment   P1, Baby 80 hours old.  Mother's nipple evert and compressible. Mother states she is breastfeeding and formula feeding due to concern over milk supply because baby seems fussy and hungry. Provided education regarding supply and demand. Recommend bf before offering formula to help establish her milk supply. Mom encouraged to feed baby 8-12 times/24 hours and with feeding cues.  Baby cueing.  Mother latched baby in cradle hold  Sucks and swallows observed. Recommend mother compress breast to keep baby active.   Maternal Data Has patient been taught Hand Expression?: Yes Does the patient have breastfeeding experience prior to this delivery?: No  Feeding Feeding Type: Breast Fed  LATCH Score Latch: Grasps breast easily, tongue down, lips flanged, rhythmical sucking.  Audible Swallowing: A few with stimulation  Type of Nipple: Everted at rest and after stimulation  Comfort (Breast/Nipple): Soft / non-tender  Hold (Positioning): Assistance needed to correctly position infant at breast and maintain latch.  LATCH Score: 8  Interventions Interventions: Breast feeding basics reviewed;Breast compression  Lactation Tools Discussed/Used     Consult Status Consult Status: Follow-up Date: 12/08/16 Follow-up type: In-patient    Vivianne Master [facility] 12/07/2016, 11:36 AM

## 2016-12-08 NOTE — Discharge Summary (Signed)
Obstetric Discharge Summary Reason for Admission: onset of labor and gestational hypertension Prenatal Procedures: ultrasound Intrapartum Procedures: vacuum Postpartum Procedures: none  Complications-Operative and Postpartum: 2nd degree perineal laceration and PPH with administration of cytotec and hemabate Hemoglobin  Date Value Ref Range Status  12/06/2016 11.7 (L) 12.0 - 15.0 g/dL Final   Hemoglobin, fingerstick  Date Value Ref Range Status  06/14/2014 14.4 12.0 - 16.0 g/dL Final   HCT  Date Value Ref Range Status  12/06/2016 34.0 (L) 36.0 - 46.0 % Final    Physical Exam:  General: alert and cooperative Lochia: appropriate Uterine Fundus: firm DVT Evaluation: No evidence of DVT seen on physical exam.  Discharge Diagnoses: Term Pregnancy-delivered  Discharge Information: Date: 12/08/2016 Activity: pelvic rest Diet: routine Medications: PNV and Ibuprofen Condition: stable Instructions: refer to practice specific booklet Discharge to: home Follow-up Information    Sanjuana Kava, MD Follow up in 4 week(s).   Specialty:  Obstetrics and Gynecology Contact information: [address] [place] [address] [phone]           Newborn Data: Live born female  Birth Weight: 9 lb 14.4 oz (4491 g) APGAR: 4, 9  Home with mother.  Deborah Simpson 12/08/2016, 9:53 AM

## 2016-12-09 ENCOUNTER — Ambulatory Visit: Payer: Self-pay

## 2016-12-09 NOTE — Lactation Note (Signed)
This note was copied from a baby's chart. Lactation Consultation Note  Patient Name: Deborah Simpson [id number] Date: 12/09/2016 Reason for consult: Follow-up assessment   With this first time mom and LGA term baby, now 30 hours old. Mom has been supplementing with formula, because the baby does not seem satisfied. I observed baby latched, and noted she bobs her head back and forth, pulling on mom's breast, and causing a shallow latch. 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 I advised mom to begin tummy time a few times a day, to see if this helps release possible tight tongue and neck muscles, and allow her tongue to extend and latch easier. I also fitted mom with a 24 nipple shield, and the baby was able to latch deeply with this, good breast movement, baby stayed deeply latched, and lots of milk seen in shield after this latch.  Mom states this latch feesl much better. The baby ddi have a wet burp, prior to the shield, spitting through her nose and mouth. I reviewed use of bulb with mom, and had her burp the baby. She had a large burp, which was part of her discomfort. Hopefully, with a  deep latch, she will make a better seal, and swallow less air.  I gave mom oral restriction resources, and lactation [facility] clinic number, to call for an o/p consult, as needed.  Mom demonstrated good technique in applying shield. Mom has a personal DEP, and If  needed, she should be able to use only EBM at this time.   Maternal Data    Feeding Feeding Type: Breast Fed Length of feed:  (baby cluster feeding, on and of, some spitting of milk with large burps)  LATCH Score Latch: Repeated attempts needed to sustain latch, nipple held in mouth throughout feeding, stimulation needed to elicit sucking reflex. (baby latached deeply with 24 nipple shield, good breast movemetn, lots of milk in shield after feeding)  Audible Swallowing:  Spontaneous and intermittent  Type of Nipple: Everted at rest and after stimulation  Comfort (Breast/Nipple): Filling, red/small blisters or bruises, mild/mod discomfort  Hold (Positioning): Assistance needed to correctly position infant at breast and maintain latch.  LATCH Score: 7  Interventions Interventions: Breast feeding basics reviewed;Assisted with latch;Skin to skin;Hand express;Adjust position;Support pillows;Position options;Expressed milk;DEBP  Lactation Tools Discussed/Used Pump Review: Setup, frequency, and cleaning   Consult Status Consult Status: Complete Follow-up type: Call as needed    Tonna Corner 12/09/2016, 8:44 AM

## 2016-12-10 ENCOUNTER — Ambulatory Visit: Payer: Self-pay

## 2016-12-10 NOTE — Lactation Note (Signed)
This note was copied from a baby's chart. Lactation Consultation Note  Patient Name: Deborah Simpson Date: 12/10/2016 Reason for consult: Follow-up assessment   64  41 days old and off phototherapy.  Mother's breast are engorged. Assisted w/ latching baby in cradle hold.  Mother breastfed for 10 min and came off. Provided mother w/ ice packs and encouraged her to post pump 5-10min to soften breasts. Mother has personal DEBP.  Provided mother w/ manual pump also. Reviewed engorgement care and monitoring voids/stools. Discussed jaundiced baby feeding behavior and the need to wake if needed. Mom encouraged to feed baby 8-12 times/24 hours and with feeding cues.     Maternal Data    Feeding Feeding Type: Breast Fed Length of feed: 10 min  LATCH Score Latch: Grasps breast easily, tongue down, lips flanged, rhythmical sucking.  Audible Swallowing: Spontaneous and intermittent  Type of Nipple: Everted at rest and after stimulation  Comfort (Breast/Nipple): Engorged, cracked, bleeding, large blisters, severe discomfort  Hold (Positioning): Assistance needed to correctly position infant at breast and maintain latch.  LATCH Score: 7  Interventions    Lactation Tools Discussed/Used     Consult Status      Vivianne Master [facility] 12/10/2016, 11:04 AM

## 2016-12-11 ENCOUNTER — Inpatient Hospital Stay (HOSPITAL_COMMUNITY): Admission: RE | Admit: 2016-12-11 | Payer: Medicaid Other | Source: Ambulatory Visit

## 2016-12-25 DIAGNOSIS — N61 Mastitis without abscess: Secondary | ICD-10-CM | POA: Insufficient documentation

## 2016-12-25 HISTORY — DX: Mastitis without abscess: N61.0

## 2017-02-14 ENCOUNTER — Other Ambulatory Visit: Payer: Self-pay | Admitting: Obstetrics & Gynecology

## 2017-02-18 ENCOUNTER — Encounter (HOSPITAL_COMMUNITY): Payer: Self-pay

## 2017-02-18 ENCOUNTER — Ambulatory Visit (HOSPITAL_COMMUNITY): Payer: Medicaid Other

## 2017-02-18 ENCOUNTER — Ambulatory Visit (HOSPITAL_COMMUNITY): Payer: Medicaid Other | Admitting: Anesthesiology

## 2017-02-18 ENCOUNTER — Ambulatory Visit (HOSPITAL_COMMUNITY)
Admission: RE | Admit: 2017-02-18 | Discharge: 2017-02-18 | Disposition: A | Discharge status: Home / Self Care | Payer: Medicaid Other | Source: Ambulatory Visit | Attending: Obstetrics & Gynecology | Admitting: Obstetrics & Gynecology

## 2017-02-18 ENCOUNTER — Encounter (HOSPITAL_COMMUNITY)
Admission: RE | Disposition: A | Discharge status: Home / Self Care | Payer: Self-pay | Source: Ambulatory Visit | Attending: Obstetrics & Gynecology

## 2017-02-18 DIAGNOSIS — O029 Abnormal product of conception, unspecified: Secondary | ICD-10-CM

## 2017-02-18 HISTORY — PX: DILATION AND EVACUATION: SHX1459

## 2017-02-18 HISTORY — DX: Nausea with vomiting, unspecified: R11.2

## 2017-02-18 HISTORY — DX: Adverse effect of unspecified anesthetic, initial encounter: T41.45XA

## 2017-02-18 HISTORY — DX: Other specified postprocedural states: Z98.890

## 2017-02-18 HISTORY — DX: Other complications of anesthesia, initial encounter: T88.59XA

## 2017-02-18 HISTORY — DX: Other specified health status: Z78.9

## 2017-02-18 LAB — CBC
HEMATOCRIT: 42.6 % (ref 36.0–46.0)
HEMOGLOBIN: 14.3 g/dL (ref 12.0–15.0)
MCH: 28.2 pg (ref 26.0–34.0)
MCHC: 33.6 g/dL (ref 30.0–36.0)
MCV: 84 fL (ref 78.0–100.0)
Platelets: 259 10*3/uL (ref 150–400)
RBC: 5.07 MIL/uL (ref 3.87–5.11)
RDW: 13.6 % (ref 11.5–15.5)
WBC: 7.9 10*3/uL (ref 4.0–10.5)

## 2017-02-18 SURGERY — DILATION AND EVACUATION, UTERUS
Anesthesia: General

## 2017-02-18 MED ORDER — DEXAMETHASONE SODIUM PHOSPHATE 4 MG/ML IJ SOLN
INTRAMUSCULAR | Status: AC
  Filled 2017-02-18: qty 1

## 2017-02-18 MED ORDER — DEXAMETHASONE SODIUM PHOSPHATE 10 MG/ML IJ SOLN
INTRAMUSCULAR | Status: DC | PRN
Start: 2017-02-18 — End: 2017-02-18
  Administered 2017-02-18: 4 mg via INTRAVENOUS

## 2017-02-18 MED ORDER — SCOPOLAMINE 1 MG/3DAYS TD PT72
MEDICATED_PATCH | TRANSDERMAL | Status: AC
  Administered 2017-02-18: 1.5 mg via TRANSDERMAL
  Filled 2017-02-18: qty 1

## 2017-02-18 MED ORDER — LIDOCAINE HCL 1 % IJ SOLN
INTRAMUSCULAR | Status: AC
  Filled 2017-02-18: qty 20

## 2017-02-18 MED ORDER — FENTANYL CITRATE (PF) 100 MCG/2ML IJ SOLN
INTRAMUSCULAR | Status: DC | PRN
  Administered 2017-02-18: 50 ug via INTRAVENOUS

## 2017-02-18 MED ORDER — FERRIC SUBSULFATE 259 MG/GM EX SOLN
CUTANEOUS | Status: AC
  Filled 2017-02-18: qty 8

## 2017-02-18 MED ORDER — HYDROMORPHONE HCL 1 MG/ML IJ SOLN: 0.2000 mg | INTRAMUSCULAR | Status: DC | PRN

## 2017-02-18 MED ORDER — PROPOFOL 10 MG/ML IV BOLUS
INTRAVENOUS | Status: DC | PRN
  Administered 2017-02-18: 200 mg via INTRAVENOUS

## 2017-02-18 MED ORDER — MIDAZOLAM HCL 2 MG/2ML IJ SOLN: 0.5000 mg | Freq: Once | INTRAMUSCULAR | Status: DC | PRN

## 2017-02-18 MED ORDER — ONDANSETRON HCL 4 MG/2ML IJ SOLN
INTRAMUSCULAR | Status: AC
  Filled 2017-02-18: qty 2

## 2017-02-18 MED ORDER — LIDOCAINE HCL (CARDIAC) 20 MG/ML IV SOLN
INTRAVENOUS | Status: DC | PRN
  Administered 2017-02-18: 100 mg via INTRAVENOUS

## 2017-02-18 MED ORDER — HYDROCODONE-ACETAMINOPHEN 5-325 MG PO TABS: 1.0000 | ORAL_TABLET | Freq: Four times a day (QID) | ORAL | 0 refills | Status: DC | PRN

## 2017-02-18 MED ORDER — IBUPROFEN 800 MG PO TABS: 800.0000 mg | ORAL_TABLET | Freq: Three times a day (TID) | ORAL | 3 refills | Status: DC | PRN

## 2017-02-18 MED ORDER — FENTANYL CITRATE (PF) 100 MCG/2ML IJ SOLN
INTRAMUSCULAR | Status: AC
  Administered 2017-02-18: 50 ug via INTRAVENOUS
  Filled 2017-02-18: qty 2

## 2017-02-18 MED ORDER — MEPERIDINE HCL 25 MG/ML IJ SOLN: 6.2500 mg | INTRAMUSCULAR | Status: DC | PRN

## 2017-02-18 MED ORDER — SCOPOLAMINE 1 MG/3DAYS TD PT72
1.0000 | MEDICATED_PATCH | Freq: Once | TRANSDERMAL | Status: DC
  Administered 2017-02-18: 1.5 mg via TRANSDERMAL

## 2017-02-18 MED ORDER — EPHEDRINE 5 MG/ML INJ
INTRAVENOUS | Status: AC
  Filled 2017-02-18: qty 10

## 2017-02-18 MED ORDER — OXYCODONE-ACETAMINOPHEN 5-325 MG PO TABS: 1.0000 | ORAL_TABLET | ORAL | Status: DC | PRN

## 2017-02-18 MED ORDER — SCOPOLAMINE 1 MG/3DAYS TD PT72
MEDICATED_PATCH | TRANSDERMAL | Status: AC
  Filled 2017-02-18: qty 1

## 2017-02-18 MED ORDER — EPHEDRINE SULFATE 50 MG/ML IJ SOLN
INTRAMUSCULAR | Status: DC | PRN
  Administered 2017-02-18: 5 mg via INTRAVENOUS

## 2017-02-18 MED ORDER — ONDANSETRON HCL 4 MG/2ML IJ SOLN
INTRAMUSCULAR | Status: DC | PRN
  Administered 2017-02-18: 4 mg via INTRAVENOUS

## 2017-02-18 MED ORDER — MENTHOL 3 MG MT LOZG
1.0000 | LOZENGE | OROMUCOSAL | Status: DC | PRN
  Filled 2017-02-18: qty 9

## 2017-02-18 MED ORDER — KETOROLAC TROMETHAMINE 30 MG/ML IJ SOLN
INTRAMUSCULAR | Status: DC | PRN
  Administered 2017-02-18: 30 mg via INTRAVENOUS

## 2017-02-18 MED ORDER — MIDAZOLAM HCL 2 MG/2ML IJ SOLN
INTRAMUSCULAR | Status: AC
  Filled 2017-02-18: qty 2

## 2017-02-18 MED ORDER — PROMETHAZINE HCL 25 MG/ML IJ SOLN: 6.2500 mg | INTRAMUSCULAR | Status: DC | PRN

## 2017-02-18 MED ORDER — PROPOFOL 10 MG/ML IV BOLUS
INTRAVENOUS | Status: AC
  Filled 2017-02-18: qty 20

## 2017-02-18 MED ORDER — ACETAMINOPHEN 325 MG PO TABS: 650.0000 mg | ORAL_TABLET | ORAL | Status: DC | PRN

## 2017-02-18 MED ORDER — ONDANSETRON HCL 4 MG PO TABS: 4.0000 mg | ORAL_TABLET | Freq: Four times a day (QID) | ORAL | Status: DC | PRN

## 2017-02-18 MED ORDER — LIDOCAINE HCL 1 % IJ SOLN
INTRAMUSCULAR | Status: DC | PRN
  Administered 2017-02-18: 10 mL

## 2017-02-18 MED ORDER — KETOROLAC TROMETHAMINE 30 MG/ML IJ SOLN: 30.0000 mg | Freq: Once | INTRAMUSCULAR | Status: DC

## 2017-02-18 MED ORDER — FENTANYL CITRATE (PF) 100 MCG/2ML IJ SOLN
25.0000 ug | INTRAMUSCULAR | Status: DC | PRN
  Administered 2017-02-18: 50 ug via INTRAVENOUS

## 2017-02-18 MED ORDER — FENTANYL CITRATE (PF) 100 MCG/2ML IJ SOLN
INTRAMUSCULAR | Status: AC
  Filled 2017-02-18: qty 2

## 2017-02-18 MED ORDER — ONDANSETRON HCL 4 MG/2ML IJ SOLN: 4.0000 mg | Freq: Four times a day (QID) | INTRAMUSCULAR | Status: DC | PRN

## 2017-02-18 MED ORDER — SILVER NITRATE-POT NITRATE 75-25 % EX MISC
CUTANEOUS | Status: AC
  Filled 2017-02-18: qty 1

## 2017-02-18 MED ORDER — LACTATED RINGERS IV SOLN
INTRAVENOUS | Status: DC
  Administered 2017-02-18: 125 mL/h via INTRAVENOUS
  Administered 2017-02-18: 13:00:00 via INTRAVENOUS

## 2017-02-18 MED ORDER — LACTATED RINGERS IV SOLN: INTRAVENOUS | Status: DC

## 2017-02-18 MED ORDER — LIDOCAINE HCL (CARDIAC) 20 MG/ML IV SOLN
INTRAVENOUS | Status: AC
  Filled 2017-02-18: qty 5

## 2017-02-18 MED ORDER — KETOROLAC TROMETHAMINE 30 MG/ML IJ SOLN
INTRAMUSCULAR | Status: AC
  Filled 2017-02-18: qty 1

## 2017-02-18 MED ORDER — FERRIC SUBSULFATE SOLN
Status: DC | PRN
  Administered 2017-02-18: 1 via TOPICAL

## 2017-02-18 SURGICAL SUPPLY — 22 items
CATH ROBINSON RED A/P 16FR (CATHETERS) ×3 IMPLANT
DECANTER SPIKE VIAL GLASS SM (MISCELLANEOUS) ×3 IMPLANT
DILATOR CANAL MILEX (MISCELLANEOUS) IMPLANT
GLOVE BIO SURGEON STRL SZ 6.5 (GLOVE) ×2 IMPLANT
GLOVE BIO SURGEONS STRL SZ 6.5 (GLOVE) ×1
GLOVE BIOGEL PI IND STRL 7.0 (GLOVE) ×3 IMPLANT
GLOVE BIOGEL PI INDICATOR 7.0 (GLOVE) ×6
GOWN STRL REUS W/TWL LRG LVL3 (GOWN DISPOSABLE) ×6 IMPLANT
KIT BERKELEY 1ST TRIMESTER 3/8 (MISCELLANEOUS) ×3 IMPLANT
NS IRRIG 1000ML POUR BTL (IV SOLUTION) ×3 IMPLANT
PACK VAGINAL MINOR WOMEN LF (CUSTOM PROCEDURE TRAY) ×3 IMPLANT
PAD OB MATERNITY 4.3X12.25 (PERSONAL CARE ITEMS) ×3 IMPLANT
PAD PREP 24X48 CUFFED NSTRL (MISCELLANEOUS) ×3 IMPLANT
SCOPETTES 8  STERILE (MISCELLANEOUS) ×2
SCOPETTES 8 STERILE (MISCELLANEOUS) IMPLANT
SET BERKELEY SUCTION TUBING (SUCTIONS) ×3 IMPLANT
SUT CHROMIC 2 0 SH (SUTURE) ×4 IMPLANT
TOWEL OR 17X24 6PK STRL BLUE (TOWEL DISPOSABLE) ×6 IMPLANT
VACURETTE 10 RIGID CVD (CANNULA) IMPLANT
VACURETTE 7MM CVD STRL WRAP (CANNULA) IMPLANT
VACURETTE 8 RIGID CVD (CANNULA) ×2 IMPLANT
VACURETTE 9 RIGID CVD (CANNULA) IMPLANT

## 2017-02-18 NOTE — Op Note (Signed)
Operative Report  Deborah Simpson female 26 y.o. February 18, 2017  Procedure: Dilatation and Curettage / Evacuation  Preoperative Diagnosis:  Retained products of conception after SVD  Post operative Diagnosis:  same   Indications: The patient with retained products of conception after spontaneous vaginal delivery     Surgeon: Sanjuana Kava   Assistants: none  Anesthesia: General LMA and Local anesthesia 1% buffered lidocaine  ASA Class: 2  Procedure Detail  DILATATION AND EVACUATION  Findings:  8 week size uterus to 6 size post procedure. Some possible membranous / placental tissue removed. Tenaculum laceration on anterior cervix repaired with figure of eight suture of 2-0 chromic.  Good crie was achieved.  Estimated Blood Loss: 50 mL         Drains: straight cath prior to procedure          Total IV Fluids: 1000 ml  Blood Given: none          Specimens: POC         Implants: none        Complications:  None         Technique:   Patient was placed in dorsal lithotomy position in [employer]. After adequate anesthesia was achieved, the patient was prepped and draped in the usual sterile fashion. The operative Graves speculum was placed in the vagina and the cervix stabilized with a single-tooth tenaculum.  A paracervical block using 1% lidocaine was performed. The cervix was dilated with Kennon Rounds dilators and the 8 mm curette was used to remove contents of the uterus.  Alternating sharp curettage with a curette and suction curettage was performed until all contents were removed and good crie was achieved.  The tenaculum was removed and there was brisk bleeding from anterior cervix. A figure of eight suture of 2-0 chromic was used to repair the laceration.  Also, Monsels was placed for hemostasis.  All instruments were removed from the vagina.  The patient tolerated the procedure well.    Disposition: PACU - hemodynamically stable.         Condition: stable  Adante Courington,  Laruen Risser [name]

## 2017-02-18 NOTE — Anesthesia Preprocedure Evaluation (Addendum)
Anesthesia Evaluation  Patient identified by MRN, date of birth, ID band Patient awake    Reviewed: Allergy & Precautions, NPO status , Patient's Chart, lab work & pertinent test results  History of Anesthesia Complications (+) PONV  Airway Mallampati: II  TM Distance: >3 FB Neck ROM: Full    Dental  (+) Dental Advisory Given   Pulmonary neg pulmonary ROS,    breath sounds clear to auscultation       Cardiovascular negative cardio ROS   Rhythm:Regular Rate:Normal     Neuro/Psych negative neurological ROS     GI/Hepatic negative GI ROS, Neg liver ROS,   Endo/Other  negative endocrine ROS  Renal/GU negative Renal ROS     Musculoskeletal   Abdominal   Peds  Hematology negative hematology ROS (+) plt 259k   Anesthesia Other Findings   Reproductive/Obstetrics (+) Breast feeding  Retained POC                            Anesthesia Physical Anesthesia Plan  ASA: I  Anesthesia Plan: General   Post-op Pain Management:    Induction: Intravenous  PONV Risk Score and Plan: 4 or greater and Ondansetron, Dexamethasone and Scopolamine patch - Pre-op  Airway Management Planned: LMA  Additional Equipment:   Intra-op Plan:   Post-operative Plan:   Informed Consent: I have reviewed the patients History and Physical, chart, labs and discussed the procedure including the risks, benefits and alternatives for the proposed anesthesia with the patient or authorized representative who has indicated his/her understanding and acceptance.   Dental advisory given  Plan Discussed with: CRNA and Surgeon  Anesthesia Plan Comments: (Plan routine monitors, GA- LMA OK Pt understands to pump and dispose of her breast milk while taking narcotic pain medication)       Anesthesia Quick Evaluation

## 2017-02-18 NOTE — Anesthesia Procedure Notes (Signed)
Procedure Name: LMA Insertion Date/Time: 02/18/2017 1:10 PM Performed by: Hewitt Blade, CRNA Pre-anesthesia Checklist: Patient identified, Emergency Drugs available, Suction available and Patient being monitored Patient Re-evaluated:Patient Re-evaluated prior to induction Oxygen Delivery Method: Circle system utilized Preoxygenation: Pre-oxygenation with 100% oxygen Induction Type: IV induction LMA: LMA inserted LMA Size: 4.0 Number of attempts: 1 Placement Confirmation: positive ETCO2 and breath sounds checked- equal and bilateral Tube secured with: Tape Dental Injury: Teeth and Oropharynx as per pre-operative assessment

## 2017-02-18 NOTE — Discharge Instructions (Signed)
DISCHARGE INSTRUCTIONS: D&C / D&E The following instructions have been prepared to help you care for yourself upon your return home.   Personal hygiene:  Use sanitary pads for vaginal drainage, not tampons.  Shower the day after your procedure.  NO tub baths, pools or Jacuzzis for 2-3 weeks.  Wipe front to back after using the bathroom.  Activity and limitations:  Do NOT drive or operate any equipment for 24 hours. The effects of anesthesia are still present and drowsiness may result.  Do NOT rest in bed all day.  Walking is encouraged.  Walk up and down stairs slowly.  You may resume your normal activity in one to two days or as indicated by your physician.  Sexual activity: NO intercourse for at least 2 weeks after the procedure, or as indicated by your physician.  Diet: Eat a light meal as desired this evening. You may resume your usual diet tomorrow.  Return to work: You may resume your work activities in one to two days or as indicated by your doctor.  What to expect after your surgery: Expect to have vaginal bleeding/discharge for 2-3 days and spotting for up to 10 days. It is not unusual to have soreness for up to 1-2 weeks. You may have a slight burning sensation when you urinate for the first day. Mild cramps may continue for a couple of days. You may have a regular period in 2-6 weeks.  Call your doctor for any of the following:  Excessive vaginal bleeding, saturating and changing one pad every hour.  Inability to urinate 6 hours after discharge from hospital.  Pain not relieved by pain medication.  Fever of 100.4 F or greater.  Unusual vaginal discharge or odor.   Call for an appointment:    Patients signature: ______________________  Nurses signature ________________________  Support person's signature_______________________     Post Anesthesia Home Care Instructions  Activity: Get plenty of rest for the remainder of the day. A responsible  individual must stay with you for 24 hours following the procedure.  For the next 24 hours, DO NOT: -Drive a car -Paediatric nurse -Drink alcoholic beverages -Take any medication unless instructed by your physician -Make any legal decisions or sign important papers.  Meals: Start with liquid foods such as gelatin or soup. Progress to regular foods as tolerated. Avoid greasy, spicy, heavy foods. If nausea and/or vomiting occur, drink only clear liquids until the nausea and/or vomiting subsides. Call your physician if vomiting continues.  Special Instructions/Symptoms: Your throat may feel dry or sore from the anesthesia or the breathing tube placed in your throat during surgery. If this causes discomfort, gargle with warm salt water. The discomfort should disappear within 24 hours.  If you had a scopolamine patch placed behind your ear for the management of post- operative nausea and/or vomiting:  1. The medication in the patch is effective for 72 hours, after which it should be removed.  Wrap patch in a tissue and discard in the trash. Wash hands thoroughly with soap and water. 2. You may remove the patch earlier than 72 hours if you experience unpleasant side effects which may include dry mouth, dizziness or visual disturbances. 3. Avoid touching the patch. Wash your hands with soap and water after contact with the patch.     NO IBUPROFEN PRODUCTS (MOTRIN, ADVIL) OR ALEVE UNTIL 7:50PM TODAY.

## 2017-02-18 NOTE — Transfer of Care (Signed)
Immediate Anesthesia Transfer of Care Note  Patient: Deborah Simpson  Procedure(s) Performed: DILATATION AND EVACUATION (N/A )  Patient Location: PACU  Anesthesia Type:General  Level of Consciousness: awake, alert  and oriented  Airway & Oxygen Therapy: Patient Spontanous Breathing and Patient connected to nasal cannula oxygen  Post-op Assessment: Report given to RN, Post -op Vital signs reviewed and stable and Patient moving all extremities  Post vital signs: Reviewed and stable  Last Vitals:  Vitals:   02/18/17 1130  BP: 123/84  Pulse: 70  Resp: 16  Temp: 37 C  SpO2: 100%    Last Pain:  Vitals:   02/18/17 1130  TempSrc: Oral      Patients Stated Pain Goal: 3 (36/01/65 8006)  Complications: No apparent anesthesia complications

## 2017-02-18 NOTE — H&P (Signed)
Deborah Simpson is an 26 y.o. female P1 presents for D&E for possible retained placental membranes from SVD on 12/06/16.  Patient has had persistent on and off bleeding and mucous discharge.  She was brought into the office for a pelvic US and hyperechoic spot was seen in the uterus c/w possible placental nodule/ membranes. Patient denies fever or chills.   Pertinent Gynecological History: Menses: irregular with intermenstrual bleeding Bleeding: intermenstrual bleeding Contraception: oral progesterone-only contraceptive DES exposure: denies Blood transfusions: none Sexually transmitted diseases: no past history Previous GYN Procedures: none  Last mammogram: n/a  Last pap: normal Date: 2017 OB History: G2, P1   Menstrual History: Menarche age: 76 No LMP recorded.    Past Medical History:  Diagnosis Date  . Abnormal pap 09/08   CIN 1  . Complication of anesthesia   . Medical history non-contributory   . PONV (postoperative nausea and vomiting)     Past Surgical History:  Procedure Laterality Date  . NO PAST SURGERIES    . WISDOM TOOTH EXTRACTION N/A at 26 yo    Family History  Problem Relation Age of Onset  . Breast cancer Mother 37  . Diabetes Father     Social History:  reports that  has never smoked. she has never used smokeless tobacco. She reports that she does not drink alcohol or use drugs.  Allergies: No Known Allergies  Medications Prior to Admission  Medication Sig Dispense Refill Last Dose  . acetaminophen (TYLENOL) 500 MG tablet Take 500 mg every 6 (six) hours as needed by mouth for moderate pain or headache.   02/18/2017 at 0800  . cephALEXin (KEFLEX) 500 MG capsule Take 500 mg 4 (four) times daily by mouth.   02/18/2017 at 0800  . norethindrone (MICRONOR,CAMILA,ERRIN) 0.35 MG tablet Take 1 tablet daily by mouth.   02/17/2017 at Unknown time  . Prenatal Vit-Fe Fumarate-FA (PRENATAL VITAMIN PO) Take 1 tablet by mouth daily.    02/17/2017 at Unknown time     ROS  - as per HPI  Blood pressure 123/84, pulse 70, temperature 98.6 F (37 C), temperature source Oral, resp. rate 16, height 5\' 8"  (1.727 m), weight 75.8 kg (167 lb), SpO2 100 %, currently breastfeeding. Physical Exam Deferred to OR  Results for orders placed or performed during the hospital encounter of 02/18/17 (from the past 24 hour(s))  CBC     Status: None   Collection Time: 02/18/17 11:15 AM  Result Value Ref Range   WBC 7.9 4.0 - 10.5 K/uL   RBC 5.07 3.87 - 5.11 MIL/uL   Hemoglobin 14.3 12.0 - 15.0 g/dL   HCT 42.6 36.0 - 46.0 %   MCV 84.0 78.0 - 100.0 fL   MCH 28.2 26.0 - 34.0 pg   MCHC 33.6 30.0 - 36.0 g/dL   RDW 13.6 11.5 - 15.5 %   Platelets 259 150 - 400 K/uL    No results found.  Assessment/Plan: 26 yo with abnormal uterine bleeding, possible retained placental nodule / membranes On call to OR for D&E Patient on Keflex for prophylaxis as she is breast feeding Discussed risks of procedure with patient to include hemorrhage, infection, uterine perforation And uterine scarring. She voiced understanding and she agrees to proceed. Consent signed and  Placed into chart.   Dorise Gangi, [place] 02/18/2017, 12:29 PM

## 2017-02-18 NOTE — Anesthesia Postprocedure Evaluation (Signed)
Anesthesia Post Note  Patient: Deborah Simpson  Procedure(s) Performed: DILATATION AND EVACUATION (N/A )     Patient location during evaluation: PACU Anesthesia Type: General Level of consciousness: awake and alert, patient cooperative and oriented Pain management: pain level controlled Vital Signs Assessment: post-procedure vital signs reviewed and stable Respiratory status: spontaneous breathing, nonlabored ventilation and respiratory function stable Cardiovascular status: blood pressure returned to baseline and stable Postop Assessment: no apparent nausea or vomiting Anesthetic complications: no    Last Vitals:  Vitals:   02/18/17 1445 02/18/17 1509  BP:  127/82  Pulse: 60 78  Resp: 16 20  Temp: 36.8 C   SpO2: 98% 100%    Last Pain:  Vitals:   02/18/17 1509  TempSrc:   PainSc: 2    Pain Goal: Patients Stated Pain Goal: 3 (02/18/17 1130)               Seleta Rhymes. Oran Dillenburg

## 2017-02-19 ENCOUNTER — Encounter (HOSPITAL_COMMUNITY): Payer: Self-pay | Admitting: Obstetrics & Gynecology

## 2017-07-30 ENCOUNTER — Encounter: Payer: Self-pay | Admitting: Physician Assistant

## 2017-10-03 NOTE — Progress Notes (Signed)
Assessment and Plan:  ADD (attention deficit disorder) -  Continue ADD medication, helps with focus, no AE's. Limit to 5 days a week or less. Reminded not to start until fully done with breast feeding.  The patient was counseled on the addictive nature of the medication and was encouraged to take drug holidays when not needed, she is currently doing a good job with this.  Follow up in 6 months  Back pain Right SI pain intermittently, negative straight leg, no bowel/bladder problems Mobic, RICE, and exercise given If not better with refer to orthopedics.   No future appointments.  HPI 26 y.o.female presents for follow up for ADD. 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 She sees a Restaurant manager, fast food and this helps. Has taken ibuprofen 800 mg and this helps most of the time.   She was off of medication while pregnant, had baby girl [place] on 12/06/2016. She did not do well with wellbutrin, has been on Adderall 20mg  PRN for focus.   She would like to restart now that done with breast feeding and starting back with school (radiology). She takes while at work or at classes, 5/7 days a week estimated need.   She is on mirena.   BMI is Body mass index is 26.3 kg/m., she has been working on diet and exercise. Discussed healthy lifestyle and ideal weight today.  Wt Readings from Last 3 Encounters:  10/04/17 173 lb (78.5 kg)  02/18/17 167 lb (75.8 kg)  12/05/16 205 lb (93 kg)    Past Medical History:  Diagnosis Date  . Abnormal pap 09/08   CIN 1  . Complication of anesthesia   . Medical history non-contributory   . PONV (postoperative nausea and vomiting)      No Known Allergies    Current Outpatient Medications on File Prior to Visit  Medication Sig Dispense Refill  . norethindrone (MICRONOR,CAMILA,ERRIN) 0.35 MG tablet Take 1 tablet daily by mouth.    . Prenatal Vit-Fe Fumarate-FA (PRENATAL VITAMIN  PO) Take 1 tablet by mouth daily.     Marland Kitchen acetaminophen (TYLENOL) 500 MG tablet Take 500 mg every 6 (six) hours as needed by mouth for moderate pain or headache.    . cephALEXin (KEFLEX) 500 MG capsule Take 500 mg 4 (four) times daily by mouth.    Marland Kitchen ibuprofen (ADVIL,MOTRIN) 800 MG tablet Take 1 tablet (800 mg total) every 8 (eight) hours as needed by mouth. (Patient not taking: Reported on 10/04/2017) 60 tablet 3   No current facility-administered medications on file prior to visit.     ROS: all negative except above.   Physical Exam: Filed Weights   10/04/17 1050  Weight: 173 lb (78.5 kg)   BP 122/74   Pulse 88   Temp 97.9 F (36.6 C)   Ht 5\' 8"  (1.727 m)   Wt 173 lb (78.5 kg)   SpO2 97%   BMI 26.30 kg/m  General Appearance: Well developed well nourished, non-toxic appearing in no apparent distress. Eyes: PERRLA, EOMs, conjunctiva w/ no swelling or erythema or discharge Sinuses: No Frontal/maxillary tenderness ENT/Mouth: Ear canals clear without swelling or erythema.  TM's normal bilaterally with no retractions, bulging, or loss of landmarks.   Neck: Supple, thyroid normal, no notable JVD  Respiratory: Respiratory effort normal, Clear breath sounds anteriorly and posteriorly bilaterally without rales, rhonchi, wheezing or stridor. No retractions or accessory muscle usage. Cardio: RRR with no MRGs.  Abdomen: Soft, + BS.  Non tender, no guarding, rebound, hernias, masses.  Musculoskeletal: Full ROM, 5/5 strength, normal gait. Neg strait leg raise. Mildly tender over R SI joint Skin: Warm, dry without rashes  Neuro: Awake and oriented X 3, Cranial nerves intact. Normal muscle tone, no cerebellar symptoms. Sensation intact.  Psych: normal affect, Insight and Judgment appropriate.     Izora Ribas, NP 11:13 AM [facility]

## 2017-10-04 ENCOUNTER — Encounter: Payer: Self-pay | Admitting: Adult Health

## 2017-10-04 ENCOUNTER — Ambulatory Visit (INDEPENDENT_AMBULATORY_CARE_PROVIDER_SITE_OTHER): Payer: Self-pay | Admitting: Adult Health

## 2017-10-04 VITALS — BP 122/74 | HR 88 | Temp 97.9°F | Ht 68.0 in | Wt 173.0 lb

## 2017-10-04 DIAGNOSIS — M545 Low back pain, unspecified: Secondary | ICD-10-CM

## 2017-10-04 DIAGNOSIS — F988 Other specified behavioral and emotional disorders with onset usually occurring in childhood and adolescence: Secondary | ICD-10-CM

## 2017-10-04 MED ORDER — MELOXICAM 15 MG PO TABS: ORAL_TABLET | ORAL | 1 refills | Status: DC

## 2017-10-04 MED ORDER — AMPHETAMINE-DEXTROAMPHETAMINE 20 MG PO TABS: 20.0000 mg | ORAL_TABLET | Freq: Every day | ORAL | 0 refills | Status: DC | PRN

## 2017-10-04 NOTE — Patient Instructions (Addendum)
Aim for 7+ servings of fruits and vegetables daily  80+ fluid ounces of water or unsweet tea for healthy kidneys  Limit to 1 drink of alcohol per day max, avoid smoking  Limit animal fats in diet for cholesterol and heart health - choose grass fed whenever available (saturated fat)  Aim for low stress - take time to unwind and care for your mental health  Aim for 150 min of moderate intensity exercise weekly for heart health, and weights twice weekly for bone health  Aim for 7-9 hours of sleep daily  Wear sunscreen/sun protion      When it comes to diets, agreement about the perfect plan isn't easy to find, even among the experts. Experts at the [employer] developed an idea known as the Healthy Eating Plate. Just imagine a plate divided into logical, healthy portions.  The emphasis is on diet quality:  Load up on vegetables and fruits - one-half of your plate: Aim for color and variety, and remember that potatoes don't count.  Go for whole grains - one-quarter of your plate: Whole wheat, barley, wheat berries, quinoa, oats, brown rice, and foods made with them. If you want pasta, go with whole wheat pasta.  Protein power - one-quarter of your plate: Fish, chicken, beans, and nuts are all healthy, versatile protein sources. Limit red meat.  The diet, however, does go beyond the plate, offering a few other suggestions.  Use healthy plant oils, such as olive, canola, soy, corn, sunflower and peanut. Check the labels, and avoid partially hydrogenated oil, which have unhealthy trans fats.  If you're thirsty, drink water. Coffee and tea are good in moderation, but skip sugary drinks and limit milk and dairy products to one or two daily servings.  The type of carbohydrate in the diet is more important than the amount. Some sources of carbohydrates, such as vegetables, fruits, whole grains, and beans-are healthier than others.  Finally, stay  active.      Back Exercises The following exercises strengthen the muscles that help to support the back. They also help to keep the lower back flexible. Doing these exercises can help to prevent back pain or lessen existing pain. If you have back pain or discomfort, try doing these exercises 2-3 times each day or as told by your health care provider. When the pain goes away, do them once each day, but increase the number of times that you repeat the steps for each exercise (do more repetitions). If you do not have back pain or discomfort, do these exercises once each day or as told by your health care provider. Exercises Single Knee to Chest  Repeat these steps 3-5 times for each leg: 1. Lie on your back on a firm bed or the floor with your legs extended. 2. Bring one knee to your chest. Your other leg should stay extended and in contact with the floor. 3. Hold your knee in place by grabbing your knee or thigh. 4. Pull on your knee until you feel a gentle stretch in your lower back. 5. Hold the stretch for 10-30 seconds. 6. Slowly release and straighten your leg.  Pelvic Tilt  Repeat these steps 5-10 times: 1. Lie on your back on a firm bed or the floor with your legs extended. 2. Bend your knees so they are pointing toward the ceiling and your feet are flat on the floor. 3. Tighten your lower abdominal muscles to press your lower back against the  floor. This motion will tilt your pelvis so your tailbone points up toward the ceiling instead of pointing to your feet or the floor. 4. With gentle tension and even breathing, hold this position for 5-10 seconds.  Cat-Cow  Repeat these steps until your lower back becomes more flexible: 1. Get into a hands-and-knees position on a firm surface. Keep your hands under your shoulders, and keep your knees under your hips. You may place padding under your knees for comfort. 2. Let your head hang down, and point your tailbone toward the floor so  your lower back becomes rounded like the back of a cat. 3. Hold this position for 5 seconds. 4. Slowly lift your head and point your tailbone up toward the ceiling so your back forms a sagging arch like the back of a cow. 5. Hold this position for 5 seconds.  Press-Ups  Repeat these steps 5-10 times: 1. Lie on your abdomen (face-down) on the floor. 2. Place your palms near your head, about shoulder-width apart. 3. While you keep your back as relaxed as possible and keep your hips on the floor, slowly straighten your arms to raise the top half of your body and lift your shoulders. Do not use your back muscles to raise your upper torso. You may adjust the placement of your hands to make yourself more comfortable. 4. Hold this position for 5 seconds while you keep your back relaxed. 5. Slowly return to lying flat on the floor.  Bridges  Repeat these steps 10 times: 1. Lie on your back on a firm surface. 2. Bend your knees so they are pointing toward the ceiling and your feet are flat on the floor. 3. Tighten your buttocks muscles and lift your buttocks off of the floor until your waist is at almost the same height as your knees. You should feel the muscles working in your buttocks and the back of your thighs. If you do not feel these muscles, slide your feet 1-2 inches farther away from your buttocks. 4. Hold this position for 3-5 seconds. 5. Slowly lower your hips to the starting position, and allow your buttocks muscles to relax completely.  If this exercise is too easy, try doing it with your arms crossed over your chest. Abdominal Crunches  Repeat these steps 5-10 times: 1. Lie on your back on a firm bed or the floor with your legs extended. 2. Bend your knees so they are pointing toward the ceiling and your feet are flat on the floor. 3. Cross your arms over your chest. 4. Tip your chin slightly toward your chest without bending your neck. 5. Tighten your abdominal muscles and slowly  raise your trunk (torso) high enough to lift your shoulder blades a tiny bit off of the floor. Avoid raising your torso higher than that, because it can put too much stress on your low back and it does not help to strengthen your abdominal muscles. 6. Slowly return to your starting position.  Back Lifts Repeat these steps 5-10 times: 1. Lie on your abdomen (face-down) with your arms at your sides, and rest your forehead on the floor. 2. Tighten the muscles in your legs and your buttocks. 3. Slowly lift your chest off of the floor while you keep your hips pressed to the floor. Keep the back of your head in line with the curve in your back. Your eyes should be looking at the floor. 4. Hold this position for 3-5 seconds. 5. Slowly return to your  starting position.  Contact a health care provider if:  Your back pain or discomfort gets much worse when you do an exercise.  Your back pain or discomfort does not lessen within 2 hours after you exercise. If you have any of these problems, stop doing these exercises right away. Do not do them again unless your health care provider says that you can. Get help right away if:  You develop sudden, severe back pain. If this happens, stop doing the exercises right away. Do not do them again unless your health care provider says that you can. This information is not intended to replace advice given to you by your health care provider. Make sure you discuss any questions you have with your health care provider. Document Released: 05/03/2004 Document Revised: 08/03/2015 Document Reviewed: 05/20/2014 Elsevier Interactive Patient Education  2017 [employer].

## 2017-11-05 ENCOUNTER — Other Ambulatory Visit: Payer: Self-pay | Admitting: Adult Health

## 2017-11-05 MED ORDER — AMPHETAMINE-DEXTROAMPHETAMINE 20 MG PO TABS: 20.0000 mg | ORAL_TABLET | Freq: Every day | ORAL | 0 refills | Status: DC | PRN

## 2018-04-01 DIAGNOSIS — E663 Overweight: Secondary | ICD-10-CM | POA: Insufficient documentation

## 2018-04-01 NOTE — Progress Notes (Deleted)
Assessment and Plan:  ADD (attention deficit disorder) -  Continue ADD medication, helps with focus, no AE's. Limit to 5 days a week or less. Reminded not to start until fully done with breast feeding.  The patient was counseled on the addictive nature of the medication and was encouraged to take drug holidays when not needed, she is currently doing a good job with this.  Follow up in 6 months  Overweight Long discussion about weight loss, diet, and exercise Recommended diet heavy in fruits and veggies and low in animal meats, cheeses, and dairy products, appropriate calorie intake Patient will work on *** Discussed appropriate weight for height (below***) and initial goal (***) Follow up at next visit   Future Appointments  Date Time Provider [place]  04/07/2018  2:30 PM Liane Comber, NP [facility] None    HPI 27 y.o.female presents for follow up for ADD. ]  *** needs future OV - ? CPE   She was off of medication while pregnant, had baby girl [place] on 12/06/2016. She did not do well with wellbutrin, has been on Adderall 20mg  PRN for focus.   This was restarted at last visit after she was done with breast feeding and starting back with school (radiology). She takes while at work or at classes, 5/7 days a week estimated need.   She is on mirena.   BMI is There is no height or weight on file to calculate BMI., she has been working on diet and exercise. Discussed healthy lifestyle and ideal weight today.  Wt Readings from Last 3 Encounters:  10/04/17 173 lb (78.5 kg)  02/18/17 167 lb (75.8 kg)  12/05/16 205 lb (93 kg)    Past Medical History:  Diagnosis Date  . Abnormal pap 09/08   CIN 1  . Complication of anesthesia   . Medical history non-contributory   . PONV (postoperative nausea and vomiting)      No Known Allergies    Current Outpatient Medications on File Prior to Visit  Medication Sig Dispense Refill  . acetaminophen (TYLENOL) 500 MG tablet Take  500 mg every 6 (six) hours as needed by mouth for moderate pain or headache.    . amphetamine-dextroamphetamine (ADDERALL) 20 MG tablet Take 1 tablet (20 mg total) by mouth daily as needed. Try to limit to 5 days a week or less. Do not take until done breast feeding. 30 tablet 0  . cephALEXin (KEFLEX) 500 MG capsule Take 500 mg 4 (four) times daily by mouth.    Marland Kitchen ibuprofen (ADVIL,MOTRIN) 800 MG tablet Take 1 tablet (800 mg total) every 8 (eight) hours as needed by mouth. (Patient not taking: Reported on 10/04/2017) 60 tablet 3  . meloxicam (MOBIC) 15 MG tablet Take one daily with food for 2 weeks, can take with tylenol, can not take with aleve, iburpofen, then as needed daily for pain 30 tablet 1  . norethindrone (MICRONOR,CAMILA,ERRIN) 0.35 MG tablet Take 1 tablet daily by mouth.    . Prenatal Vit-Fe Fumarate-FA (PRENATAL VITAMIN PO) Take 1 tablet by mouth daily.      No current facility-administered medications on file prior to visit.     ROS: all negative except above.   Physical Exam: There were no vitals filed for this visit. There were no vitals taken for this visit. General Appearance: Well developed well nourished, non-toxic appearing in no apparent distress. Eyes: PERRLA, EOMs, conjunctiva w/ no swelling or erythema or discharge Sinuses: No Frontal/maxillary tenderness ENT/Mouth: Ear canals clear without  swelling or erythema.  TM's normal bilaterally with no retractions, bulging, or loss of landmarks.   Neck: Supple, thyroid normal, no notable JVD  Respiratory: Respiratory effort normal, Clear breath sounds anteriorly and posteriorly bilaterally without rales, rhonchi, wheezing or stridor. No retractions or accessory muscle usage. Cardio: RRR with no MRGs.   Abdomen: Soft, + BS.  Non tender, no guarding, rebound, hernias, masses.  Musculoskeletal: Full ROM, 5/5 strength, normal gait. Neg strait leg raise. Mildly tender over R SI joint Skin: Warm, dry without rashes  Neuro: Awake and  oriented X 3, Cranial nerves intact. Normal muscle tone, no cerebellar symptoms. Sensation intact.  Psych: normal affect, Insight and Judgment appropriate.     Izora Ribas, NP 10:48 AM [facility]

## 2018-04-07 ENCOUNTER — Ambulatory Visit: Payer: Self-pay | Admitting: Adult Health

## 2018-12-29 ENCOUNTER — Emergency Department (HOSPITAL_COMMUNITY): Payer: Medicaid Other

## 2018-12-29 ENCOUNTER — Emergency Department (HOSPITAL_COMMUNITY)
Admission: EM | Admit: 2018-12-29 | Discharge: 2018-12-29 | Disposition: A | Discharge status: Home / Self Care | Payer: Medicaid Other | Attending: Emergency Medicine | Admitting: Emergency Medicine

## 2018-12-29 ENCOUNTER — Encounter (HOSPITAL_COMMUNITY): Payer: Self-pay

## 2018-12-29 ENCOUNTER — Other Ambulatory Visit: Payer: Self-pay

## 2018-12-29 ENCOUNTER — Ambulatory Visit (HOSPITAL_COMMUNITY)
Admission: EM | Admit: 2018-12-29 | Discharge: 2018-12-29 | Disposition: A | Discharge status: Other Acute Inpatient Hospital | Payer: Medicaid Other

## 2018-12-29 DIAGNOSIS — M542 Cervicalgia: Secondary | ICD-10-CM | POA: Diagnosis not present

## 2018-12-29 DIAGNOSIS — M549 Dorsalgia, unspecified: Secondary | ICD-10-CM

## 2018-12-29 DIAGNOSIS — M545 Low back pain, unspecified: Secondary | ICD-10-CM

## 2018-12-29 MED ORDER — OXYCODONE-ACETAMINOPHEN 5-325 MG PO TABS: 1.0000 | ORAL_TABLET | Freq: Four times a day (QID) | ORAL | 0 refills | Status: DC | PRN

## 2018-12-29 MED ORDER — OXYCODONE-ACETAMINOPHEN 5-325 MG PO TABS
1.0000 | ORAL_TABLET | Freq: Once | ORAL | Status: AC
  Administered 2018-12-29: 22:00:00 1 via ORAL
  Filled 2018-12-29: qty 1

## 2018-12-29 MED ORDER — KETOROLAC TROMETHAMINE 30 MG/ML IJ SOLN
30.0000 mg | Freq: Once | INTRAMUSCULAR | Status: AC
  Administered 2018-12-29: 30 mg via INTRAMUSCULAR
  Filled 2018-12-29: qty 1

## 2018-12-29 NOTE — ED Provider Notes (Signed)
[facility]    CSN: [id number] Arrival date & time: 12/29/18  1333      History   Chief Complaint Chief Complaint  Patient presents with  . Back Pain    HPI Deborah Simpson is a 28 y.o. female.   Patient presents with head, neck, and back pain since she was thrown off of a horse yesterday.  She struck her head but denies loss of consciousness.  She denies numbness or weakness.  She reports acute pain and swelling in her lower back.  LMP: IUD.    The history is provided by the patient.    Past Medical History:  Diagnosis Date  . Abnormal pap 09/08   CIN 1  . Complication of anesthesia   . Medical history non-contributory   . PONV (postoperative nausea and vomiting)     Patient Active Problem List   Diagnosis Date Noted  . Overweight (BMI 25.0-29.9) 04/01/2018  . Family history of breast cancer in mother 01/27/2016  . ADD (attention deficit disorder) 07/12/2015    Past Surgical History:  Procedure Laterality Date  . DILATION AND EVACUATION N/A 02/18/2017   Procedure: DILATATION AND EVACUATION;  Surgeon: Sanjuana Kava, MD;  Location: [facility];  Service: Gynecology;  Laterality: N/A;  . NO PAST SURGERIES    . WISDOM TOOTH EXTRACTION N/A at 28 yo    OB History    Gravida  2   Para  1   Term  1   Preterm      AB  1   Living  1     SAB      TAB  1   Ectopic      Multiple  0   Live Births  1            Home Medications    Prior to Admission medications   Medication Sig Start Date End Date Taking? Authorizing Provider  acetaminophen (TYLENOL) 500 MG tablet Take 500 mg every 6 (six) hours as needed by mouth for moderate pain or headache.    [provider]  amphetamine-dextroamphetamine (ADDERALL) 20 MG tablet Take 1 tablet (20 mg total) by mouth daily as needed. Try to limit to 5 days a week or less. Do not take until done breast feeding. 11/05/17 11/05/18  Liane Comber, NP  cephALEXin (KEFLEX) 500 MG capsule Take 500 mg 4  (four) times daily by mouth. 02/14/17   [provider]  ibuprofen (ADVIL,MOTRIN) 800 MG tablet Take 1 tablet (800 mg total) every 8 (eight) hours as needed by mouth. Patient not taking: Reported on 10/04/2017 02/18/17   Sanjuana Kava, MD  meloxicam [facility]) 15 MG tablet Take one daily with food for 2 weeks, can take with tylenol, can not take with aleve, iburpofen, then as needed daily for pain 10/04/17   Liane Comber, NP  norethindrone (MICRONOR,CAMILA,ERRIN) 0.35 MG tablet Take 1 tablet daily by mouth.    [provider]  Prenatal Vit-Fe Fumarate-FA (PRENATAL VITAMIN PO) Take 1 tablet by mouth daily.     [provider]    Family History Family History  Problem Relation Age of Onset  . Breast cancer Mother 38  . Diabetes Father     Social History Social History   Tobacco Use  . Smoking status: Never Smoker  . Smokeless tobacco: Never Used  Substance Use Topics  . Alcohol use: No  . Drug use: No     Allergies   Patient has no known allergies.  Review of Systems Review of Systems  Constitutional: Negative for chills and fever.  HENT: Negative for ear pain and sore throat.   Eyes: Negative for pain and visual disturbance.  Respiratory: Negative for cough and shortness of breath.   Cardiovascular: Negative for chest pain and palpitations.  Gastrointestinal: Negative for abdominal pain and vomiting.  Genitourinary: Negative for dysuria and hematuria.  Musculoskeletal: Positive for back pain and neck pain. Negative for arthralgias.  Skin: Negative for color change and rash.  Neurological: Negative for seizures and syncope.  All other systems reviewed and are negative.    Physical Exam Triage Vital Signs ED Triage Vitals [12/29/18 1404]  Enc Vitals Group     BP 129/79     Pulse Rate 95     Resp 18     Temp 98.8 F (37.1 C)     Temp Source Oral     SpO2 99 %     Weight      Height      Head Circumference      Peak Flow      Pain Score       Pain Loc      Pain Edu?      Excl. in [place]?    No data found.  Updated Vital Signs BP 129/79 (BP Location: Right Arm)   Pulse 95   Temp 98.8 F (37.1 C) (Oral)   Resp 18   SpO2 99%   Visual Acuity Right Eye Distance:   Left Eye Distance:   Bilateral Distance:    Right Eye Near:   Left Eye Near:    Bilateral Near:     Physical Exam Vitals signs and nursing note reviewed.  Constitutional:      General: She is not in acute distress.    Appearance: She is well-developed.  HENT:     Head: Normocephalic and atraumatic.  Eyes:     Conjunctiva/sclera: Conjunctivae normal.     Pupils: Pupils are equal, round, and reactive to light.  Neck:     Musculoskeletal: Neck supple.  Cardiovascular:     Rate and Rhythm: Normal rate and regular rhythm.     Heart sounds: No murmur.  Pulmonary:     Effort: Pulmonary effort is normal. No respiratory distress.     Breath sounds: Normal breath sounds.  Abdominal:     Palpations: Abdomen is soft.     Tenderness: There is no abdominal tenderness.  Musculoskeletal:        General: Swelling, tenderness and signs of injury present. No deformity.     Comments: Tenderness across lower back.    Skin:    General: Skin is warm and dry.     Capillary Refill: Capillary refill takes less than 2 seconds.     Findings: Bruising present.     Comments: Ecchymosis and abrasions across lower back.    Neurological:     General: No focal deficit present.     Mental Status: She is alert and oriented to person, place, and time.     Sensory: No sensory deficit.     Motor: No weakness.      UC Treatments / Results  Labs (all labs ordered are listed, but only abnormal results are displayed) Labs Reviewed - No data to display  EKG   Radiology No results found.  Procedures Procedures (including critical care time)  Medications Ordered in UC Medications - No data to display  Initial Impression / Assessment and Plan / UC  Course  I have  reviewed the triage vital signs and the nursing notes.  Pertinent labs & imaging results that were available during my care of the patient were reviewed by me and considered in my medical decision making (see chart for details).     Neck and back pain after being thrown from a horse.  Ecchymosis and abrasions across lower back.  Sending patient to the ED for evaluation.     Final Clinical Impressions(s) / UC Diagnoses   Final diagnoses:  Neck pain  Acute bilateral back pain, unspecified back location     Discharge Instructions     Go to the emergency department for evaluation of your neck and back pain after being thrown from a horse yesterday.      ED Prescriptions    None     PDMP not reviewed this encounter.   Sharion Balloon, NP 12/29/18 [phone]

## 2018-12-29 NOTE — ED Notes (Signed)
Patient is being discharged from the [facility] and sent to the Emergency Department via POV. Per KT, patient is stable but in need of higher level of care due to eval for fall from horse. Patient is aware and verbalizes understanding of plan of care.  Vitals:   12/29/18 1404  BP: 129/79  Pulse: 95  Resp: 18  Temp: 98.8 F (37.1 C)  SpO2: 99%

## 2018-12-29 NOTE — ED Notes (Signed)
Pt transported to MRI 

## 2018-12-29 NOTE — ED Triage Notes (Signed)
Pt presents with back pain after she was thrown off of her horse yesterday.

## 2018-12-29 NOTE — Discharge Instructions (Addendum)
Go to the emergency department for evaluation of your neck and back pain after being thrown from a horse yesterday.

## 2018-12-29 NOTE — ED Triage Notes (Signed)
Pt states she was thrown off of a horse yesterday. Pt was seen at urgent care this morning and sent here for a CT scan. Pt is complaining of back pain and a sore neck. Pt denies LOC. Pt alert and oriented x4.

## 2018-12-29 NOTE — ED Provider Notes (Signed)
[facility] EMERGENCY DEPARTMENT Provider Note   CSN: [id number] Arrival date & time: 12/29/18  1445     History   Chief Complaint Chief Complaint  Patient presents with   Fall   Neck Injury   Back Pain    HPI Deborah Simpson is a 28 y.o. female.     HPI    28 year old female presents status post fall.  Patient reports that yesterday she was on a horse when she was bucked off she landed on her back.  She notes pain to the lower lumbar region.  She notes some cracking sensation in her lower back.  She denies any distal neurological deficits.  She notes taking medication at home that did not improve her symptoms.  She denies any upper back or significant neck pain.  She denies any abdominal or chest pain.  She is not pregnant or breast-feeding.  Past Medical History:  Diagnosis Date   Abnormal pap [id number]   CIN 1   Complication of anesthesia    Medical history non-contributory    PONV (postoperative nausea and vomiting)     Patient Active Problem List   Diagnosis Date Noted   Overweight (BMI 25.0-29.9) 04/01/2018   Family history of breast cancer in mother 01/27/2016   ADD (attention deficit disorder) 07/12/2015    Past Surgical History:  Procedure Laterality Date   DILATION AND EVACUATION N/A 02/18/2017   Procedure: DILATATION AND EVACUATION;  Surgeon: Sanjuana Kava, MD;  Location: [facility];  Service: Gynecology;  Laterality: N/A;   NO PAST SURGERIES     WISDOM TOOTH EXTRACTION N/A at 28 yo     OB History    Gravida  2   Para  1   Term  1   Preterm      AB  1   Living  1     SAB      TAB  1   Ectopic      Multiple  0   Live Births  1            Home Medications    Prior to Admission medications   Medication Sig Start Date End Date Taking? Authorizing Provider  acetaminophen (TYLENOL) 500 MG tablet Take 500 mg every 6 (six) hours as needed by mouth for moderate pain or headache.    [provider]    amphetamine-dextroamphetamine (ADDERALL) 20 MG tablet Take 1 tablet (20 mg total) by mouth daily as needed. Try to limit to 5 days a week or less. Do not take until done breast feeding. 11/05/17 11/05/18  Liane Comber, NP  cephALEXin (KEFLEX) 500 MG capsule Take 500 mg 4 (four) times daily by mouth. 02/14/17   [provider]  ibuprofen (ADVIL,MOTRIN) 800 MG tablet Take 1 tablet (800 mg total) every 8 (eight) hours as needed by mouth. Patient not taking: Reported on 10/04/2017 02/18/17   Sanjuana Kava, MD  meloxicam [facility]) 15 MG tablet Take one daily with food for 2 weeks, can take with tylenol, can not take with aleve, iburpofen, then as needed daily for pain 10/04/17   Liane Comber, NP  norethindrone (MICRONOR,CAMILA,ERRIN) 0.35 MG tablet Take 1 tablet daily by mouth.    [provider]  oxyCODONE-acetaminophen (PERCOCET/ROXICET) 5-325 MG tablet Take 1 tablet by mouth every 6 (six) hours as needed. 12/29/18   Avyay Coger, Dellis Filbert, PA-C  Prenatal Vit-Fe Fumarate-FA (PRENATAL VITAMIN PO) Take 1 tablet by mouth daily.     [provider]  Family History Family History  Problem Relation Age of Onset   Breast cancer Mother 50   Diabetes Father     Social History Social History   Tobacco Use   Smoking status: Never Smoker   Smokeless tobacco: Never Used  Substance Use Topics   Alcohol use: No   Drug use: No     Allergies   Patient has no known allergies.   Review of Systems Review of Systems  All other systems reviewed and are negative.   Physical Exam Updated Vital Signs BP 116/87 (BP Location: Left Arm)    Pulse 88    Temp 98.5 F (36.9 C) (Oral)    Resp 18    SpO2 97%   Physical Exam Vitals signs and nursing note reviewed.  Constitutional:      Appearance: She is well-developed.  HENT:     Head: Normocephalic and atraumatic.  Eyes:     General: No scleral icterus.       Right eye: No discharge.        Left eye: No discharge.      Conjunctiva/sclera: Conjunctivae normal.     Pupils: Pupils are equal, round, and reactive to light.  Neck:     Musculoskeletal: Normal range of motion.     Vascular: No JVD.     Trachea: No tracheal deviation.  Pulmonary:     Effort: Pulmonary effort is normal.     Breath sounds: No stridor.     Comments: Chest nontender-ribs nontender Abdominal:     General: There is no distension.     Palpations: Abdomen is soft.     Tenderness: There is no abdominal tenderness.  Musculoskeletal:     Comments: No C or T-spine tenderness palpation exquisite tenderness palpation of the lower lumbar midline no step-offs or deformities, no bruising noted to the back or flank, patient does have a birthmark at her lower back, she has superficial abrasions noted at the lower back as well-bilateral upper and lower extremity sensation strength motor function intact and equal  Neurological:     Mental Status: She is alert and oriented to person, place, and time.     Coordination: Coordination normal.  Psychiatric:        Behavior: Behavior normal.        Thought Content: Thought content normal.        Judgment: Judgment normal.     ED Treatments / Results  Labs (all labs ordered are listed, but only abnormal results are displayed) Labs Reviewed - No data to display  EKG None  Radiology Dg Lumbar Spine Complete  Result Date: 12/29/2018 CLINICAL DATA:  Lumbago.  Thrown off horse 1 day prior EXAM: LUMBAR SPINE - COMPLETE 4+ VIEW COMPARISON:  None. FINDINGS: Frontal, lateral, spot lumbosacral lateral, and bilateral oblique views were obtained. There are 5 non-rib-bearing lumbar type vertebral bodies. 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 There is 1.5 cm of anterolisthesis of L5 on S1 with marked disc space narrowing at L5-S1. No acute fracture is appreciable by radiography. No other spondylolisthesis. Disc spaces appear unremarkable except for the L5-S1 level. There is no appreciable facet arthropathy.  Intrauterine device positioned in the pelvis. IMPRESSION: Apparent pars defects at L5 bilaterally with 1.5 cm of spondylolisthesis of L5 on S1 and severe disc space narrowing at L5-S1. No other spondylolisthesis. No well-defined acute fracture evident. Given the clinical history, exacerbation of spondylolisthesis at the site of pars defects is quite possible. It may well be prudent  to correlate with lumbar MR to assess for marrow edema in the regions of the pars defects to assess for potential acute injury contributing to the appearance currently. Other disc spaces appear unremarkable. Intrauterine device present within the pelvis. Electronically Signed   By: Lowella Grip III M.D.   On: 12/29/2018 17:28   Mr Lumbar Spine Wo Contrast  Result Date: 12/29/2018 CLINICAL DATA:  Low back pain since the patient was thrown from a horse yesterday. Initial encounter. EXAM: MRI LUMBAR SPINE WITHOUT CONTRAST TECHNIQUE: Multiplanar, multisequence MR imaging of the lumbar spine was performed. No intravenous contrast was administered. COMPARISON:  None. FINDINGS: Segmentation:  Standard. Alignment: 1.2 cm anterolisthesis L5 on S1 due to chronic bilateral L5 pars interarticularis defects is noted. Vertebrae:  No fracture, evidence of discitis, or bone lesion. Conus medullaris and cauda equina: Conus extends to the L1 level. Conus and cauda equina appear normal. Paraspinal and other soft tissues: Negative. Disc levels: T10-11 to L4-5 are negative. L5-S1: The disc is uncovered with a mild bulge. The central canal is open. Anterolisthesis results in mild to moderate foraminal narrowing, worse on the right. IMPRESSION: No acute abnormality. Chronic bilateral L5 pars interarticularis defects result in 1.2 cm anterolisthesis L5 on S1. The central canal is open at this level but anterolisthesis and disc cause mild to moderate foraminal narrowing, worse on the right. Electronically Signed   By: Inge Rise M.D.   On:  12/29/2018 21:25    Procedures Procedures (including critical care time)  Medications Ordered in ED Medications  ketorolac (TORADOL) 30 MG/ML injection 30 mg (30 mg Intramuscular Given 12/29/18 1656)  oxyCODONE-acetaminophen (PERCOCET/ROXICET) 5-325 MG per tablet 1 tablet (1 tablet Oral Given 12/29/18 2146)     Initial Impression / Assessment and Plan / ED Course  I have reviewed the triage vital signs and the nursing notes.  Pertinent labs & imaging results that were available during my care of the patient were reviewed by me and considered in my medical decision making (see chart for details).         Assessment/Plan: 89 YOF here SP fall.  Initial x-ray showing spondylolisthesis on L5-S1.  This was an acute injury.  Given point tenderness follow-up with MRI which showed no acute abnormality, showing chronic bilateral pars interarticularis defects.  Repeat neuro exam showed no acute neurological deficits.  Patient discharged with outpatient follow-up pain medicine and strict return precautions.  Verbalized understanding and agreement to today's plan.    Final Clinical Impressions(s) / ED Diagnoses   Final diagnoses:  Acute midline low back pain without sciatica    ED Discharge Orders         Ordered    oxyCODONE-acetaminophen (PERCOCET/ROXICET) 5-325 MG tablet  Every 6 hours PRN     12/29/18 2149           Okey Regal, PA-C 12/30/18 Doneen Poisson, MD 12/30/18 2216

## 2018-12-29 NOTE — Discharge Instructions (Addendum)
Please read attached information. If you experience any new or worsening signs or symptoms please return to the emergency room for evaluation. Please follow-up with your primary care provider or specialist as discussed. Please use medication prescribed only as directed and discontinue taking if you have any concerning signs or symptoms.   °

## 2018-12-29 NOTE — ED Triage Notes (Signed)
PT from [facility] Note: Patient presents with head, neck, and back pain since she was thrown off of a horse yesterday.  She struck her head but denies loss of consciousness.  She denies numbness or weakness.  She reports acute pain and swelling in her lower back.

## 2018-12-31 ENCOUNTER — Ambulatory Visit (INDEPENDENT_AMBULATORY_CARE_PROVIDER_SITE_OTHER): Payer: Medicaid Other | Admitting: Orthopedic Surgery

## 2018-12-31 ENCOUNTER — Encounter: Payer: Self-pay | Admitting: Orthopedic Surgery

## 2018-12-31 DIAGNOSIS — M545 Low back pain, unspecified: Secondary | ICD-10-CM

## 2018-12-31 MED ORDER — DICLOFENAC SODIUM 75 MG PO TBEC: DELAYED_RELEASE_TABLET | ORAL | 0 refills | Status: DC

## 2018-12-31 MED ORDER — METHOCARBAMOL 500 MG PO TABS: ORAL_TABLET | ORAL | 0 refills | Status: DC

## 2018-12-31 MED ORDER — OXYCODONE HCL 5 MG PO TABS: ORAL_TABLET | ORAL | 0 refills | Status: DC

## 2018-12-31 NOTE — Progress Notes (Signed)
Office Visit Note   Patient: Deborah Simpson           Date of Birth: 29-Mar-1991           MRN: [id number] Visit Date: 12/31/2018 Requested by: Marliss Coots, NP [place],  [place] [address] PCP: Marliss Coots, NP  Subjective: Chief Complaint  Patient presents with  . Lower Back - Pain    HPI: Deborah Simpson is a patient with low back pain.  She was thrown from a horse on Sunday and landed on her back.  No prior injury.  Denies any leg pain or numbness and tingling.  She has been taking oxycodone from the emergency room.  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  She does have a 28-year-old at home.  MRI scan and radiographs are reviewed.  Nothing acute other than some soft tissue swelling right around L4-5 but she does have a grade 2 slip at L5-S1 which appears chronic.  She does describe "pulling something" when she was riding 6 years ago which was treated by chiropractor with no previous radiographs.              ROS: All systems reviewed are negative as they relate to the chief complaint within the history of present illness.  Patient denies  fevers or chills.   Assessment & Plan: Visit Diagnoses:  1. Acute bilateral low back pain without sciatica     Plan: Impression is acute low back pain with soft tissue swelling but no radicular symptoms in the lower back region.  No nerve root tension signs today.  Plan is lumbar corset with Voltaren oxycodone refilled x1 only and Robaxin as a muscle relaxer.  In terms of getting back to riding a horse I think that may be a 2 to 3-week proposition when she can walk up and down stairs without pain.  Long-term I think this spondylolisthesis at L5-S1 may be problematic.  Need repeat lateral x-ray in 6 months to see if it is progressing.  Follow-Up Instructions: Return in about 6 months (around 06/30/2019).   Orders:  No orders of the defined types were placed in this encounter.  Meds ordered this  encounter  Medications  . diclofenac (VOLTAREN) 75 MG EC tablet    Sig: 1 po bid x 3 weeks    Dispense:  30 tablet    Refill:  0  . methocarbamol (ROBAXIN) 500 MG tablet    Sig: 1 po tid prn spasm    Dispense:  30 tablet    Refill:  0  . oxyCODONE (OXY IR/ROXICODONE) 5 MG immediate release tablet    Sig: 1 po bid prn pain    Dispense:  20 tablet    Refill:  0      Procedures: No procedures performed   Clinical Data: No additional findings.  Objective: Vital Signs: There were no vitals taken for this visit.  Physical Exam:   Constitutional: Patient appears well-developed HEENT:  Head: Normocephalic Eyes:EOM are normal Neck: Normal range of motion Cardiovascular: Normal rate Pulmonary/chest: Effort normal Neurologic: Patient is alert Skin: Skin is warm Psychiatric: Patient has normal mood and affect    Ortho Exam: Ortho exam demonstrates slightly antalgic gait with pain with forward lateral bending.  She has no nerve root tension signs and good ankle dorsiflexion plantarflexion quad hamstring strength with no paresthesias L1 S1 bilaterally  Specialty Comments:  No specialty comments available.  Imaging: No results found.  PMFS History: Patient Active Problem List   Diagnosis Date Noted  . Overweight (BMI 25.0-29.9) 04/01/2018  . Family history of breast cancer in mother 01/27/2016  . ADD (attention deficit disorder) 07/12/2015   Past Medical History:  Diagnosis Date  . Abnormal pap 09/08   CIN 1  . Complication of anesthesia   . Medical history non-contributory   . PONV (postoperative nausea and vomiting)     Family History  Problem Relation Age of Onset  . Breast cancer Mother 7  . Diabetes Father     Past Surgical History:  Procedure Laterality Date  . DILATION AND EVACUATION N/A 02/18/2017   Procedure: DILATATION AND EVACUATION;  Surgeon: Sanjuana Kava, MD;  Location: [facility];  Service: Gynecology;  Laterality: N/A;  . NO PAST SURGERIES    .  WISDOM TOOTH EXTRACTION N/A at 28 yo   Social History   Occupational History  . Not on file  Tobacco Use  . Smoking status: Never Smoker  . Smokeless tobacco: Never Used  Substance and Sexual Activity  . Alcohol use: No  . Drug use: No  . Sexual activity: Yes    Partners: Male    Birth control/protection: Pill

## 2019-01-29 ENCOUNTER — Other Ambulatory Visit: Payer: Self-pay | Admitting: Surgical

## 2019-01-29 ENCOUNTER — Telehealth: Payer: Self-pay | Admitting: Orthopedic Surgery

## 2019-01-29 MED ORDER — TRAMADOL HCL 50 MG PO TABS: 50.0000 mg | ORAL_TABLET | Freq: Three times a day (TID) | ORAL | 0 refills | Status: DC | PRN

## 2019-01-29 NOTE — Telephone Encounter (Signed)
Please advise 

## 2019-01-29 NOTE — Telephone Encounter (Signed)
I called patient and advised.  She still has voltaren and robaxin. She would like for you to call Tramadol. Sent in by [facility].

## 2019-01-29 NOTE — Telephone Encounter (Signed)
Patient called needing Rx refilled (Oxycodone) The number to contact patient is [phone]

## 2019-02-10 ENCOUNTER — Telehealth: Payer: Self-pay | Admitting: Orthopedic Surgery

## 2019-02-10 NOTE — Telephone Encounter (Signed)
Pt called in requesting a refill on oxycodone, please have that sent to [facility] on  [address].   [phone]

## 2019-02-11 ENCOUNTER — Telehealth: Payer: Self-pay | Admitting: Orthopedic Surgery

## 2019-02-11 NOTE — Telephone Encounter (Signed)
See other note regarding patient. Thanks.

## 2019-02-11 NOTE — Telephone Encounter (Signed)
Pt called in checking on her refill request for oxycodone, please have that sent to [facility] on [address].  [phone]

## 2019-02-11 NOTE — Telephone Encounter (Signed)
Please advise. Thanks.  

## 2019-02-12 NOTE — Telephone Encounter (Signed)
Tried calling patient. No answer. LMVM advising per Lurena Joiner.

## 2019-03-09 ENCOUNTER — Telehealth: Payer: Self-pay | Admitting: Orthopedic Surgery

## 2019-03-09 ENCOUNTER — Other Ambulatory Visit: Payer: Self-pay | Admitting: Surgical

## 2019-03-09 MED ORDER — TRAMADOL HCL 50 MG PO TABS: 50.0000 mg | ORAL_TABLET | Freq: Two times a day (BID) | ORAL | 0 refills | Status: DC | PRN

## 2019-03-09 NOTE — Telephone Encounter (Signed)
Rx refill Tramadol °

## 2019-03-09 NOTE — Telephone Encounter (Signed)
Please advise. Thanks.  

## 2019-03-31 ENCOUNTER — Telehealth: Payer: Self-pay | Admitting: Orthopedic Surgery

## 2019-03-31 ENCOUNTER — Other Ambulatory Visit: Payer: Self-pay | Admitting: Surgical

## 2019-03-31 MED ORDER — TRAMADOL HCL 50 MG PO TABS: 50.0000 mg | ORAL_TABLET | Freq: Every day | ORAL | 0 refills | Status: AC | PRN

## 2019-03-31 NOTE — Telephone Encounter (Signed)
Submitted to [facility] on [address].  This is last refill for tramadol or any narcotics.  If patient has continued pain, recommend she schedule appointment to discuss.

## 2019-03-31 NOTE — Telephone Encounter (Signed)
Patient called and stated needed refill of Tramadol.  Please call patient to advise.  [phone]

## 2019-03-31 NOTE — Telephone Encounter (Signed)
Please advise. Thanks.  

## 2019-05-01 ENCOUNTER — Telehealth: Payer: Self-pay | Admitting: Orthopedic Surgery

## 2019-05-01 NOTE — Telephone Encounter (Signed)
Patient called.   She is requesting a refill on her pain medicine but she wants to discuss getting a prescription for a different kind.   Call back [phone]

## 2019-05-01 NOTE — Telephone Encounter (Signed)
I called patient. She states that tramadol really does not help and she is requesting something else.  I advised, per last note in chart, no other medications would be prescribed until follow up in the office if she was having continued pain. Appointment was made for next Thursday morning. Patient would like to know if you would prescribe tramadol or something stronger until appt? I advised most likely not but that I would ask.

## 2019-05-01 NOTE — Telephone Encounter (Signed)
Please advise 

## 2019-05-04 NOTE — Telephone Encounter (Signed)
I left voicemail for patient advising. 

## 2019-05-06 ENCOUNTER — Telehealth: Payer: Self-pay | Admitting: Surgical

## 2019-05-06 NOTE — Telephone Encounter (Signed)
Will further discuss at next OV

## 2019-05-06 NOTE — Telephone Encounter (Signed)
Received call drom patient concerning Rx refill for Tramadol. I read note from [facility] to patient. Patient said she has an appointment tomorrow. The number to contact patient if needed is [phone]

## 2019-05-07 ENCOUNTER — Encounter: Payer: Self-pay | Admitting: Orthopedic Surgery

## 2019-05-07 ENCOUNTER — Ambulatory Visit (INDEPENDENT_AMBULATORY_CARE_PROVIDER_SITE_OTHER): Payer: Medicaid Other | Admitting: Orthopedic Surgery

## 2019-05-07 ENCOUNTER — Other Ambulatory Visit: Payer: Self-pay | Admitting: Surgical

## 2019-05-07 ENCOUNTER — Ambulatory Visit (INDEPENDENT_AMBULATORY_CARE_PROVIDER_SITE_OTHER): Payer: Medicaid Other

## 2019-05-07 ENCOUNTER — Telehealth: Payer: Self-pay | Admitting: Orthopedic Surgery

## 2019-05-07 ENCOUNTER — Other Ambulatory Visit: Payer: Self-pay

## 2019-05-07 DIAGNOSIS — M545 Low back pain, unspecified: Secondary | ICD-10-CM

## 2019-05-07 MED ORDER — CYCLOBENZAPRINE HCL 10 MG PO TABS: 10.0000 mg | ORAL_TABLET | Freq: Every day | ORAL | 0 refills | Status: DC

## 2019-05-07 MED ORDER — ACETAMINOPHEN-CODEINE #3 300-30 MG PO TABS: 1.0000 | ORAL_TABLET | Freq: Every day | ORAL | 0 refills | Status: DC | PRN

## 2019-05-07 NOTE — Telephone Encounter (Signed)
Patient called.   She was seen earlier today but her pharmacy has not yet received the prescription we were supposed to send in .   Patient call back: [phone]

## 2019-05-07 NOTE — Progress Notes (Signed)
Office Visit Note   Patient: Deborah Simpson           Date of Birth: 1990/05/02           MRN: [id number] Visit Date: 05/07/2019 Requested by: Marliss Coots, NP [place],  [place] [address] PCP: Marliss Coots, NP  Subjective: Chief Complaint  Patient presents with  . Lower Back - Pain    HPI: Deborah Simpson is a 29 year old patient with low back pain.  She has known spondylolisthesis at L5-S1.  MRI scan from 920 shows the spondylolisthesis along with some moderate foraminal narrowing.  Now she reports new onset of some radicular symptoms down both legs.  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  She states that the pain is fairly severe on some days.  Tramadol is not helping.  Muscle relaxers do help her sleep some.              ROS: All systems reviewed are negative as they relate to the chief complaint within the history of present illness.  Patient denies  fevers or chills.   Assessment & Plan: Visit Diagnoses:  1. Acute bilateral low back pain without sciatica     Plan: Impression is worsening low back pain from spondylolisthesis with no weakness and no nerve root tension signs.  Plan is one-time prescription for Tylenol 3.  If she requires continued narcotic medication we did discuss that pain management would be her best option.  Flexeril also prescribed for muscle spasm at night.  Like to send her to Dr. Ernestina Patches for epidural steroid injections.  In general I think this is a surgical problem.  She does not really want to consider surgery for 1 to 2 years due to the age of her 38-year-old.  Nonetheless I think that if this gets worse is going to be a more difficult problem to treat with less predictable long-term outcome.  All this is discussed with the patient.  Follow-Up Instructions: No follow-ups on file.   Orders:  Orders Placed This Encounter  Procedures  . XR Lumbar Spine 2-3 Views  . Ambulatory referral to Physical Medicine Rehab   Meds  ordered this encounter  Medications  . acetaminophen-codeine (TYLENOL #3) 300-30 MG tablet    Sig: Take 1 tablet by mouth daily as needed for moderate pain.    Dispense:  30 tablet    Refill:  0  . cyclobenzaprine (FLEXERIL) 10 MG tablet    Sig: Take 1 tablet (10 mg total) by mouth at bedtime.    Dispense:  30 tablet    Refill:  0      Procedures: No procedures performed   Clinical Data: No additional findings.  Objective: Vital Signs: There were no vitals taken for this visit.  Physical Exam:   Constitutional: Patient appears well-developed HEENT:  Head: Normocephalic Eyes:EOM are normal Neck: Normal range of motion Cardiovascular: Normal rate Pulmonary/chest: Effort normal Neurologic: Patient is alert Skin: Skin is warm Psychiatric: Patient has normal mood and affect    Ortho Exam: Ortho exam demonstrates full active and passive range of motion of the hips.  Patient has good ankle dorsiflexion plantarflexion strength with palpable pedal pulses.  No nerve root tension signs on the right-hand side mild nerve root tension signs on the left.  No definite paresthesias L1 S1 bilaterally.  Negative clonus.  Reflexes symmetric bilateral patella and Achilles 1+ out of 4.  Specialty Comments:  No specialty comments available.  Imaging: XR Lumbar Spine 2-3 Views  Result Date: 05/07/2019 AP lateral lumbar spine reviewed.  Grade 2 spondylolisthesis L5-S1 is present unchanged in magnitude compared to plain radiographs from 4 months ago.  No acute fracture.    PMFS History: Patient Active Problem List   Diagnosis Date Noted  . Overweight (BMI 25.0-29.9) 04/01/2018  . Family history of breast cancer in mother 01/27/2016  . ADD (attention deficit disorder) 07/12/2015   Past Medical History:  Diagnosis Date  . Abnormal pap 09/08   CIN 1  . Complication of anesthesia   . Medical history non-contributory   . PONV (postoperative nausea and vomiting)     Family History    Problem Relation Age of Onset  . Breast cancer Mother 74  . Diabetes Father     Past Surgical History:  Procedure Laterality Date  . DILATION AND EVACUATION N/A 02/18/2017   Procedure: DILATATION AND EVACUATION;  Surgeon: Sanjuana Kava, MD;  Location: [facility];  Service: Gynecology;  Laterality: N/A;  . NO PAST SURGERIES    . WISDOM TOOTH EXTRACTION N/A at 29 yo   Social History   Occupational History  . Not on file  Tobacco Use  . Smoking status: Never Smoker  . Smokeless tobacco: Never Used  Substance and Sexual Activity  . Alcohol use: No  . Drug use: No  . Sexual activity: Yes    Partners: Male    Birth control/protection: Pill

## 2019-05-08 NOTE — Telephone Encounter (Signed)
Pls advise. Thanks.  

## 2019-05-12 ENCOUNTER — Telehealth: Payer: Self-pay | Admitting: Orthopedic Surgery

## 2019-05-12 NOTE — Telephone Encounter (Signed)
Forwarded initial message to [place] to further advise.

## 2019-05-12 NOTE — Telephone Encounter (Signed)
Medication patient was given for her back Tylenol #3 is not working and patient due back to work Architectural technologist.  Please call patient [phone]

## 2019-05-12 NOTE — Telephone Encounter (Signed)
Pls advise. Thanks.  

## 2019-05-12 NOTE — Telephone Encounter (Signed)
Repeat call.   Refer to last message from [place].

## 2019-05-13 ENCOUNTER — Telehealth: Payer: Self-pay | Admitting: Orthopedic Surgery

## 2019-05-13 DIAGNOSIS — M545 Low back pain, unspecified: Secondary | ICD-10-CM

## 2019-05-13 NOTE — Telephone Encounter (Signed)
Patient called and stated that she was told bu Dr Marlou Sa she needed to go to a Pain Mgmt Clinic. Patient stated she was told she needed a referral to:  [facility] [place]  Please call patient to advise [phone]

## 2019-05-13 NOTE — Telephone Encounter (Signed)
See other note. I will call and discuss with patient.

## 2019-05-14 NOTE — Telephone Encounter (Signed)
IC s/w patient at length Advised referral submitted for [facility]

## 2019-05-20 ENCOUNTER — Encounter: Payer: Medicaid Other | Admitting: Physical Medicine and Rehabilitation

## 2019-07-01 ENCOUNTER — Ambulatory Visit: Payer: Medicaid Other | Admitting: Orthopedic Surgery

## 2020-02-24 IMAGING — DX DG LUMBAR SPINE COMPLETE 4+V
5 series · 5 of 5 positions shown · non-contrast
Comparison: None.

CLINICAL DATA: Lumbago.  Thrown off horse 1 day prior

EXAM:
LUMBAR SPINE - COMPLETE 4+ VIEW

[l-spine ap]
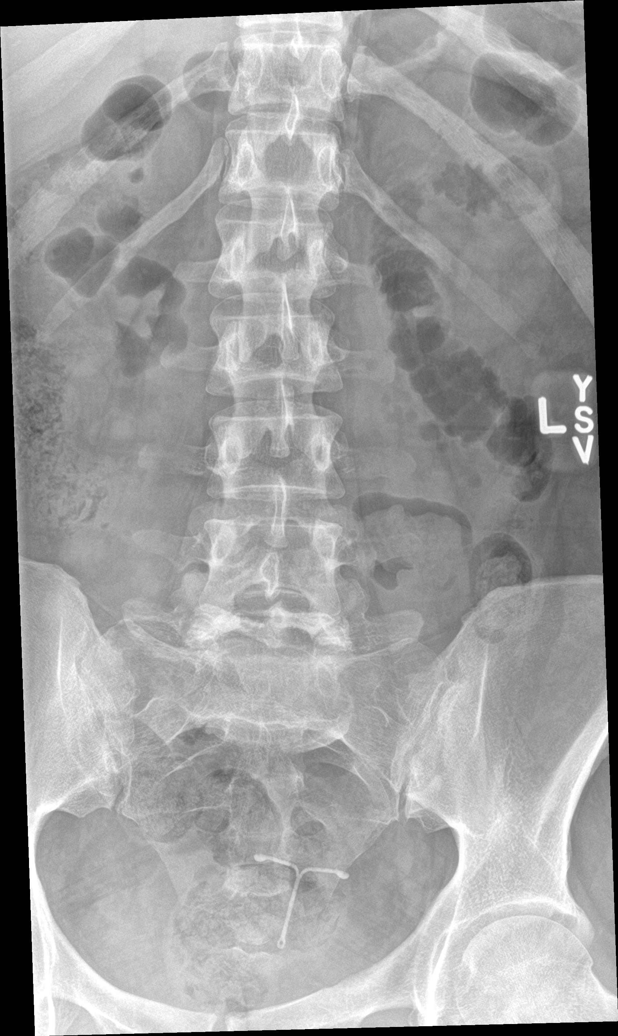

[l-spine obl (1 of 2)]
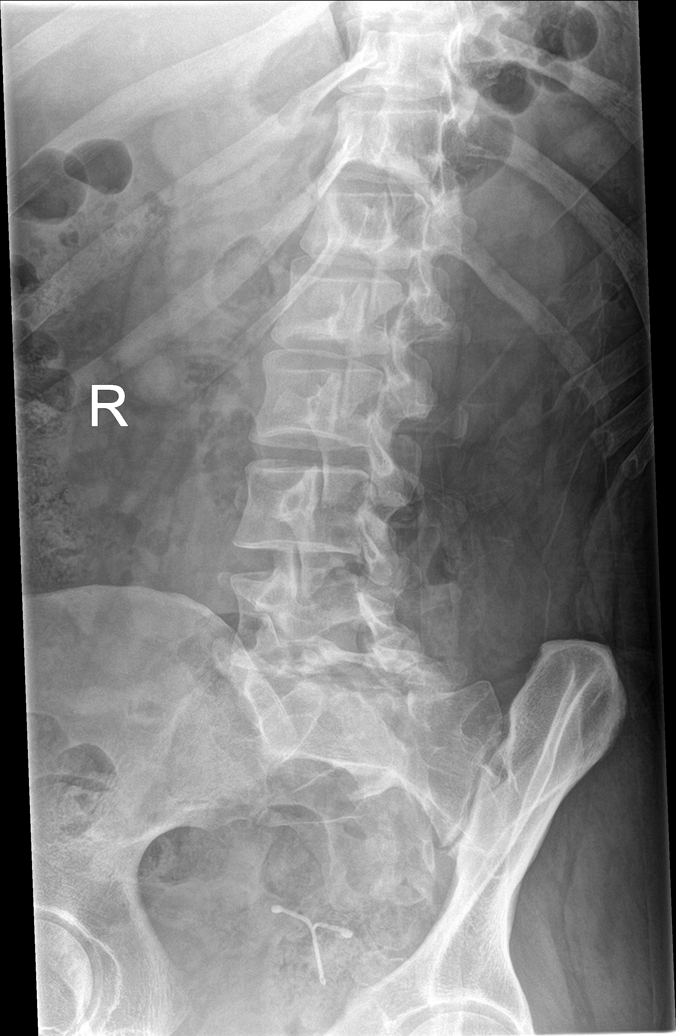

[l-spine obl (2 of 2)]
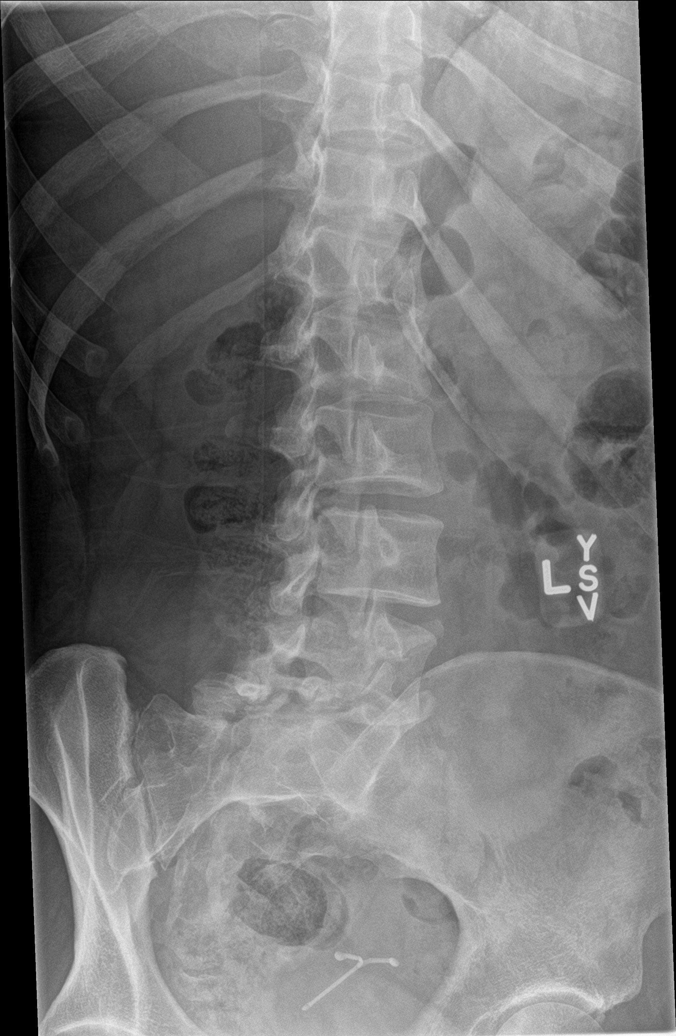

[l-spine lat]
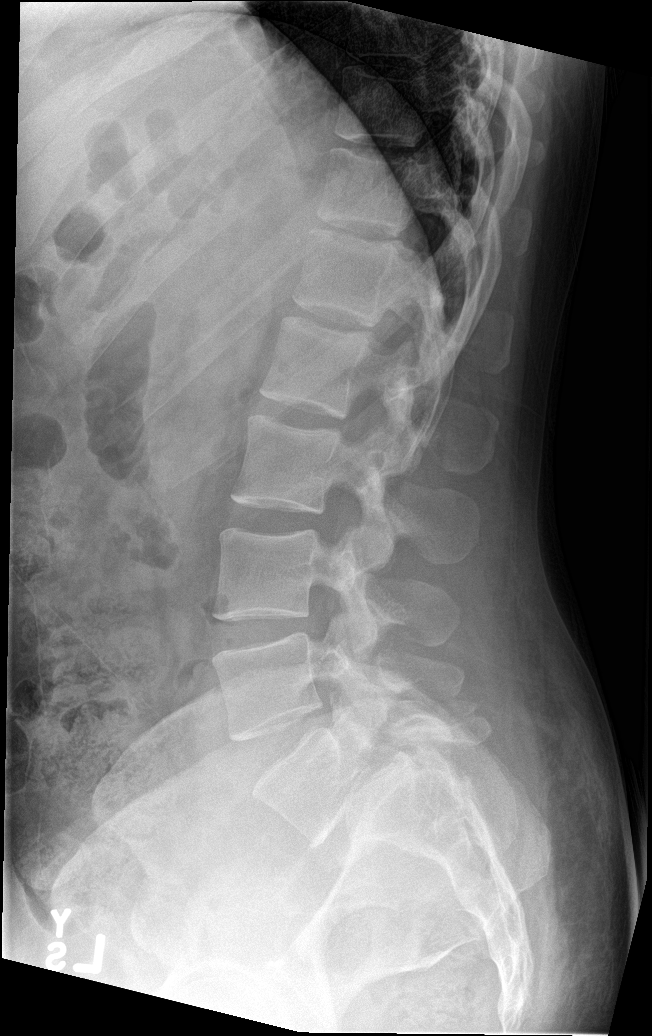

[l-spine spot]
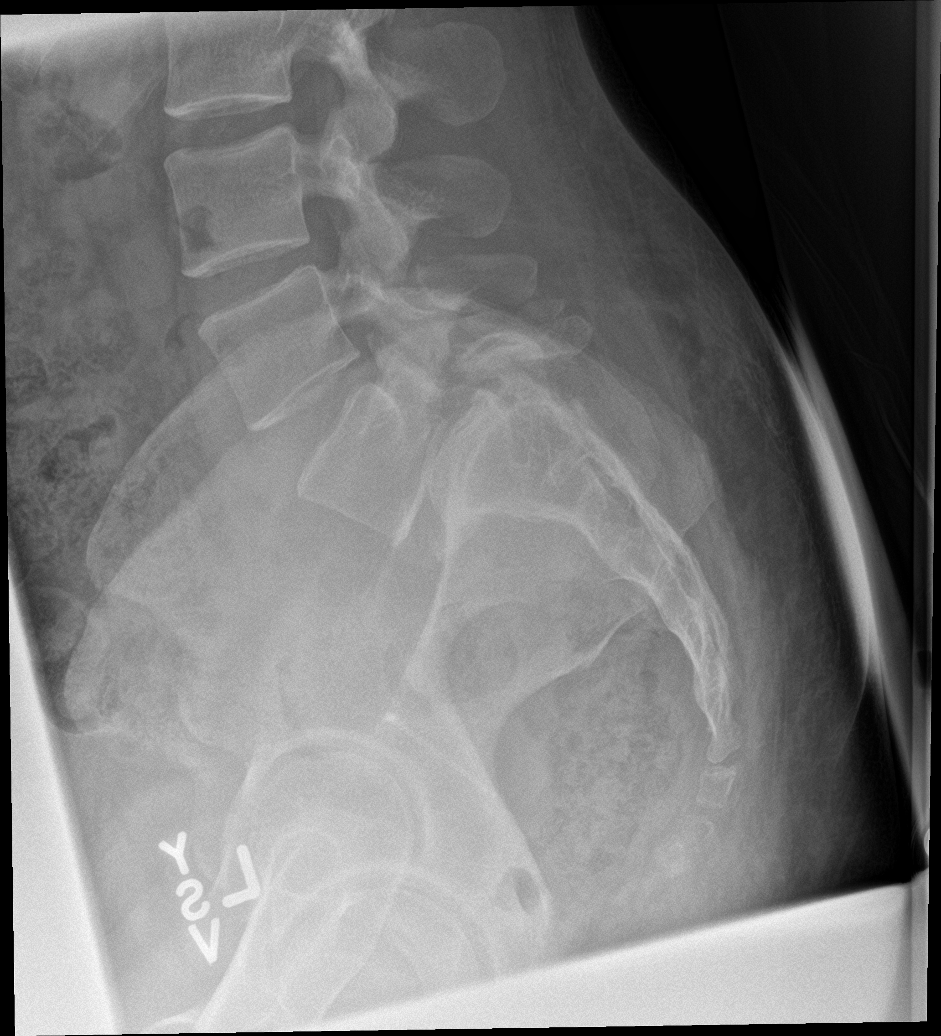

[5 of 5 positions shown; findings below may reference images not displayed]

FINDINGS: Frontal, lateral, spot lumbosacral lateral, and bilateral oblique
views were obtained. There are 5 non-rib-bearing lumbar type
vertebral bodies. There are pars defects at L5 bilaterally. There is
1.5 cm of anterolisthesis of L5 on S1 with marked disc space
narrowing at L5-S1. No acute fracture is appreciable by radiography.
No other spondylolisthesis. Disc spaces appear unremarkable except
for the L5-S1 level. There is no appreciable facet arthropathy.

Intrauterine device positioned in the pelvis.
IMPRESSION: Apparent pars defects at L5 bilaterally with 1.5 cm of
spondylolisthesis of L5 on S1 and severe disc space narrowing at
L5-S1. No other spondylolisthesis. No well-defined acute fracture
evident. Given the clinical history, exacerbation of
spondylolisthesis at the site of pars defects is quite possible. It
may well be prudent to correlate with lumbar MR to assess for marrow
edema in the regions of the pars defects to assess for potential
acute injury contributing to the appearance currently.

Other disc spaces appear unremarkable.

Intrauterine device present within the pelvis.

## 2021-05-06 ENCOUNTER — Other Ambulatory Visit: Payer: Self-pay

## 2021-05-06 ENCOUNTER — Ambulatory Visit
Admission: RE | Admit: 2021-05-06 | Discharge: 2021-05-06 | Disposition: A | Discharge status: Home / Self Care | Payer: Medicaid Other | Source: Ambulatory Visit | Attending: Internal Medicine | Admitting: Internal Medicine

## 2021-05-06 VITALS — BP 128/82 | HR 101 | Temp 98.0°F | Resp 18

## 2021-05-06 DIAGNOSIS — H109 Unspecified conjunctivitis: Secondary | ICD-10-CM

## 2021-05-06 DIAGNOSIS — B9689 Other specified bacterial agents as the cause of diseases classified elsewhere: Secondary | ICD-10-CM | POA: Diagnosis not present

## 2021-05-06 MED ORDER — ERYTHROMYCIN 5 MG/GM OP OINT: TOPICAL_OINTMENT | OPHTHALMIC | 0 refills | Status: DC

## 2021-05-06 NOTE — ED Triage Notes (Signed)
Pt c/o bilat conjunctivitis that is slightly painful and pt states feels swollen. Onset yesterday.

## 2021-05-06 NOTE — Discharge Instructions (Signed)
You have pinkeye of both eyes which is being treated with antibiotic ointment.  Please follow-up with eye doctor if symptoms persist or worsen.

## 2021-05-06 NOTE — ED Provider Notes (Signed)
[facility]    CSN: [id number] Arrival date & time: 05/06/21  1402      History   Chief Complaint Chief Complaint  Patient presents with   Conjunctivitis    HPI Deborah Simpson is a 31 y.o. female.   Patient presents with bilateral eye redness and drainage that has been present for the past few days.  Reports that she has had associated upper respiratory symptoms with this as well.  Denies any trauma or foreign bodies to the eye.  She reports that she has had some crustiness to the eyes.  She reports that she woke up with the symptoms.  Denies any blurry vision.  Patient does not wear contacts.   Conjunctivitis   Past Medical History:  Diagnosis Date   Abnormal pap 25/42   CIN 1   Complication of anesthesia    Medical history non-contributory    PONV (postoperative nausea and vomiting)     Patient Active Problem List   Diagnosis Date Noted   Overweight (BMI 25.0-29.9) 04/01/2018   Family history of breast cancer in mother 01/27/2016   ADD (attention deficit disorder) 07/12/2015    Past Surgical History:  Procedure Laterality Date   DILATION AND EVACUATION N/A 02/18/2017   Procedure: DILATATION AND EVACUATION;  Surgeon: Sanjuana Kava, MD;  Location: [facility];  Service: Gynecology;  Laterality: N/A;   NO PAST SURGERIES     WISDOM TOOTH EXTRACTION N/A at 31 yo    OB History     Gravida  2   Para  1   Term  1   Preterm      AB  1   Living  1      SAB      IAB  1   Ectopic      Multiple  0   Live Births  1            Home Medications    Prior to Admission medications   Medication Sig Start Date End Date Taking? Authorizing Provider  erythromycin ophthalmic ointment Place a 1/2 inch ribbon of ointment into the lower eyelid of both eyes 4 times daily for 7 days. 05/06/21  Yes Exie Chrismer, Hildred Alamin E, FNP  acetaminophen (TYLENOL) 500 MG tablet Take 500 mg every 6 (six) hours as needed by mouth for moderate pain or headache.    [provider]  acetaminophen-codeine (TYLENOL #3) 300-30 MG tablet Take 1 tablet by mouth daily as needed for moderate pain. 05/07/19   Magnant, Charles L, PA-C  amphetamine-dextroamphetamine (ADDERALL) 20 MG tablet Take 1 tablet (20 mg total) by mouth daily as needed. Try to limit to 5 days a week or less. Do not take until done breast feeding. 11/05/17 11/05/18  Liane Comber, NP  cephALEXin (KEFLEX) 500 MG capsule Take 500 mg 4 (four) times daily by mouth. 02/14/17   [provider]  cyclobenzaprine (FLEXERIL) 10 MG tablet Take 1 tablet (10 mg total) by mouth at bedtime. 05/07/19   Meredith Pel, MD  diclofenac (VOLTAREN) 75 MG EC tablet 1 po bid x 3 weeks 12/31/18   Meredith Pel, MD  ibuprofen (ADVIL,MOTRIN) 800 MG tablet Take 1 tablet (800 mg total) every 8 (eight) hours as needed by mouth. Patient not taking: Reported on 10/04/2017 02/18/17   Sanjuana Kava, MD  meloxicam (MOBIC) 15 MG tablet Take one daily with food for 2 weeks, can take with tylenol, can not take with aleve, iburpofen, then as needed daily  for pain 10/04/17   Liane Comber, NP  methocarbamol (ROBAXIN) 500 MG tablet 1 po tid prn spasm 12/31/18   Meredith Pel, MD  norethindrone (MICRONOR,CAMILA,ERRIN) 0.35 MG tablet Take 1 tablet daily by mouth.    [provider]  oxyCODONE (OXY IR/ROXICODONE) 5 MG immediate release tablet 1 po bid prn pain 12/31/18   Meredith Pel, MD  oxyCODONE-acetaminophen (PERCOCET/ROXICET) 5-325 MG tablet Take 1 tablet by mouth every 6 (six) hours as needed. 12/29/18   Hedges, Dellis Filbert, PA-C  Prenatal Vit-Fe Fumarate-FA (PRENATAL VITAMIN PO) Take 1 tablet by mouth daily.     [provider]    Family History Family History  Problem Relation Age of Onset   Breast cancer Mother 32   Diabetes Father     Social History Social History   Tobacco Use   Smoking status: Never   Smokeless tobacco: Never  Substance Use Topics   Alcohol use: No   Drug use: No      Allergies   Patient has no known allergies.   Review of Systems Review of Systems Per HPI  Physical Exam Triage Vital Signs ED Triage Vitals [05/06/21 1411]  Enc Vitals Group     BP 128/82     Pulse Rate (!) 101     Resp 18     Temp 98 F (36.7 C)     Temp Source Oral     SpO2 97 %     Weight      Height      Head Circumference      Peak Flow      Pain Score 0     Pain Loc      Pain Edu?      Excl. in [place]?    No data found.  Updated Vital Signs BP 128/82 (BP Location: Left Arm)    Pulse (!) 101    Temp 98 F (36.7 C) (Oral)    Resp 18    SpO2 97%    Breastfeeding No   Visual Acuity Right Eye Distance: 20/20 Left Eye Distance: 20/20 Bilateral Distance: 20/20  Right Eye Near:   Left Eye Near:    Bilateral Near:     Physical Exam Constitutional:      General: She is not in acute distress.    Appearance: Normal appearance. She is not toxic-appearing or diaphoretic.  HENT:     Head: Normocephalic and atraumatic.  Eyes:     General: Lids are normal. Lids are everted, no foreign bodies appreciated. Vision grossly intact. Gaze aligned appropriately.     Extraocular Movements: Extraocular movements intact.     Conjunctiva/sclera:     Right eye: Right conjunctiva is injected. No chemosis, exudate or hemorrhage.    Left eye: Left conjunctiva is injected. No chemosis, exudate or hemorrhage. Pulmonary:     Effort: Pulmonary effort is normal.  Neurological:     General: No focal deficit present.     Mental Status: She is alert and oriented to person, place, and time. Mental status is at baseline.  Psychiatric:        Mood and Affect: Mood normal.        Behavior: Behavior normal.        Thought Content: Thought content normal.        Judgment: Judgment normal.     UC Treatments / Results  Labs (all labs ordered are listed, but only abnormal results are displayed) Labs Reviewed - No data to display  EKG   Radiology No results  found.  Procedures Procedures (including critical care time)  Medications Ordered in UC Medications - No data to display  Initial Impression / Assessment and Plan / UC Course  I have reviewed the triage vital signs and the nursing notes.  Pertinent labs & imaging results that were available during my care of the patient were reviewed by me and considered in my medical decision making (see chart for details).     Physical exam is consistent with bilateral bacterial conjunctivitis.  Will treat with erythromycin ointment.  Discussed supportive care.  Patient to follow-up with eye doctor if symptoms persist or worsen.  Discussed return precautions.  Patient verbalized understanding and was agreeable with plan. Final Clinical Impressions(s) / UC Diagnoses   Final diagnoses:  Bacterial conjunctivitis of both eyes     Discharge Instructions      You have pinkeye of both eyes which is being treated with antibiotic ointment.  Please follow-up with eye doctor if symptoms persist or worsen.    ED Prescriptions     Medication Sig Dispense Auth. Provider   erythromycin ophthalmic ointment Place a 1/2 inch ribbon of ointment into the lower eyelid of both eyes 4 times daily for 7 days. 3.5 g Teodora Medici, [place]      PDMP not reviewed this encounter.   Teodora Medici, [place] 05/06/21 [phone]

## 2021-08-11 ENCOUNTER — Ambulatory Visit
Admission: RE | Admit: 2021-08-11 | Discharge: 2021-08-11 | Disposition: A | Discharge status: Home / Self Care | Payer: Medicaid Other | Source: Ambulatory Visit | Attending: Internal Medicine | Admitting: Internal Medicine

## 2021-08-11 VITALS — BP 133/85 | HR 92 | Temp 98.3°F | Resp 18

## 2021-08-11 DIAGNOSIS — J029 Acute pharyngitis, unspecified: Secondary | ICD-10-CM | POA: Insufficient documentation

## 2021-08-11 DIAGNOSIS — K120 Recurrent oral aphthae: Secondary | ICD-10-CM | POA: Diagnosis present

## 2021-08-11 LAB — POCT RAPID STREP A (OFFICE): Rapid Strep A Screen: NEGATIVE

## 2021-08-11 LAB — POCT MONO SCREEN (KUC): Mono, POC: NEGATIVE

## 2021-08-11 MED ORDER — NYSTATIN 100000 UNIT/ML MT SUSP: 5.0000 mL | Freq: Four times a day (QID) | OROMUCOSAL | 0 refills | Status: DC | PRN

## 2021-08-11 NOTE — Discharge Instructions (Signed)
Strep and monotest were negative.  Throat culture is pending.  We will call if it is positive.  It appears that you have ulcers in your mouth which is being treated with magic mouthwash that you will swish and spit.  They should resolve on their own.  Please follow-up if symptoms persist or worsen. ?

## 2021-08-11 NOTE — ED Triage Notes (Signed)
Patient presents to Urgent Care with complaints of sores in throat since last week. Patient reports painful swallowing, sore throat.  ? ?

## 2021-08-11 NOTE — ED Provider Notes (Signed)
?[place] ? ? ? ?CSN: [id number] ?Arrival date & time: 08/11/21  1551 ? ? ?  ? ?History   ?Chief Complaint ?Chief Complaint  ?Patient presents with  ? Mouth Lesions  ?  have sore on tonsils which seems to be getting more of and have had them for a couple weeks now without other symptoms - Entered by patient  ? ? ?HPI ?Deborah Simpson is a 31 y.o. female.  ? ?Patient presents with sore throat and lesions on the back of the throat that have been present for approximately 1 to 2 weeks.  Denies any associated upper respiratory symptoms, cough, fever.  Denies any known sick contacts.  Denies chest pain, shortness of breath, nausea, vomiting, diarrhea, abdominal pain. ? ? ?Mouth Lesions ? ?Past Medical History:  ?Diagnosis Date  ? Abnormal pap 09/08  ? CIN 1  ? Complication of anesthesia   ? Medical history non-contributory   ? PONV (postoperative nausea and vomiting)   ? ? ?Patient Active Problem List  ? Diagnosis Date Noted  ? Overweight (BMI 25.0-29.9) 04/01/2018  ? Family history of breast cancer in mother 01/27/2016  ? ADD (attention deficit disorder) 07/12/2015  ? ? ?Past Surgical History:  ?Procedure Laterality Date  ? DILATION AND EVACUATION N/A 02/18/2017  ? Procedure: DILATATION AND EVACUATION;  Surgeon: Sanjuana Kava, MD;  Location: [facility];  Service: Gynecology;  Laterality: N/A;  ? NO PAST SURGERIES    ? WISDOM TOOTH EXTRACTION N/A at 31 yo  ? ? ?OB History   ? ? Gravida  ?2  ? Para  ?1  ? Term  ?1  ? Preterm  ?   ? AB  ?1  ? Living  ?1  ?  ? ? SAB  ?   ? IAB  ?1  ? Ectopic  ?   ? Multiple  ?0  ? Live Births  ?1  ?   ?  ?  ? ? ? ?Home Medications   ? ?Prior to Admission medications   ?Medication Sig Start Date End Date Taking? Authorizing Provider  ?magic mouthwash (nystatin, lidocaine, diphenhydrAMINE) suspension Take 5 mLs by mouth 4 (four) times daily as needed for mouth pain. Swish and spit 08/11/21  Yes Teodora Medici, FNP  ?acetaminophen (TYLENOL) 500 MG tablet Take 500 mg every 6 (six) hours  as needed by mouth for moderate pain or headache.    [provider]  ?acetaminophen-codeine (TYLENOL #3) 300-30 MG tablet Take 1 tablet by mouth daily as needed for moderate pain. 05/07/19   Magnant, Charles L, PA-C  ?amphetamine-dextroamphetamine (ADDERALL) 20 MG tablet Take 1 tablet (20 mg total) by mouth daily as needed. Try to limit to 5 days a week or less. Do not take until done breast feeding. 11/05/17 11/05/18  Liane Comber, NP  ?cephALEXin (KEFLEX) 500 MG capsule Take 500 mg 4 (four) times daily by mouth. 02/14/17   [provider]  ?cyclobenzaprine (FLEXERIL) 10 MG tablet Take 1 tablet (10 mg total) by mouth at bedtime. 05/07/19   Meredith Pel, MD  ?diclofenac (VOLTAREN) 75 MG EC tablet 1 po bid x 3 weeks 12/31/18   Meredith Pel, MD  ?erythromycin ophthalmic ointment Place a 1/2 inch ribbon of ointment into the lower eyelid of both eyes 4 times daily for 7 days. 05/06/21   Teodora Medici, FNP  ?ibuprofen (ADVIL,MOTRIN) 800 MG tablet Take 1 tablet (800 mg total) every 8 (eight) hours as needed by mouth. ?Patient not  taking: Reported on 10/04/2017 02/18/17   Sanjuana Kava, MD  ?meloxicam (MOBIC) 15 MG tablet Take one daily with food for 2 weeks, can take with tylenol, can not take with aleve, iburpofen, then as needed daily for pain 10/04/17   Liane Comber, NP  ?methocarbamol (ROBAXIN) 500 MG tablet 1 po tid prn spasm 12/31/18   Meredith Pel, MD  ?norethindrone (MICRONOR,CAMILA,ERRIN) 0.35 MG tablet Take 1 tablet daily by mouth.    [provider]  ?oxyCODONE (OXY IR/ROXICODONE) 5 MG immediate release tablet 1 po bid prn pain 12/31/18   Meredith Pel, MD  ?oxyCODONE-acetaminophen (PERCOCET/ROXICET) 5-325 MG tablet Take 1 tablet by mouth every 6 (six) hours as needed. 12/29/18   Okey Regal, PA-C  ?Prenatal Vit-Fe Fumarate-FA (PRENATAL VITAMIN PO) Take 1 tablet by mouth daily.     [provider]  ? ? ?Family History ?Family History  ?Problem  Relation Age of Onset  ? Breast cancer Mother 64  ? Diabetes Father   ? ? ?Social History ?Social History  ? ?Tobacco Use  ? Smoking status: Never  ? Smokeless tobacco: Never  ?Substance Use Topics  ? Alcohol use: No  ? Drug use: No  ? ? ? ?Allergies   ?Patient has no known allergies. ? ? ?Review of Systems ?Review of Systems ?Per HPI ? ?Physical Exam ?Triage Vital Signs ?ED Triage Vitals  ?Enc Vitals Group  ?   BP 08/11/21 1610 133/85  ?   Pulse Rate 08/11/21 1610 92  ?   Resp 08/11/21 1610 18  ?   Temp 08/11/21 1610 98.3 ?F (36.8 ?C)  ?   Temp Source 08/11/21 1610 Oral  ?   SpO2 08/11/21 1610 95 %  ?   Weight --   ?   Height --   ?   Head Circumference --   ?   Peak Flow --   ?   Pain Score 08/11/21 1609 6  ?   Pain Loc --   ?   Pain Edu? --   ?   Excl. in [place]? --   ? ?No data found. ? ?Updated Vital Signs ?BP 133/85 (BP Location: Right Arm)   Pulse 92   Temp 98.3 ?F (36.8 ?C) (Oral)   Resp 18   LMP 07/17/2021   SpO2 95%  ? ?Visual Acuity ?Right Eye Distance:   ?Left Eye Distance:   ?Bilateral Distance:   ? ?Right Eye Near:   ?Left Eye Near:    ?Bilateral Near:    ? ?Physical Exam ?Constitutional:   ?   General: She is not in acute distress. ?   Appearance: Normal appearance. She is not toxic-appearing or diaphoretic.  ?HENT:  ?   Head: Normocephalic and atraumatic.  ?   Right Ear: Tympanic membrane and ear canal normal.  ?   Left Ear: Tympanic membrane and ear canal normal.  ?   Nose: Nose normal.  ?   Mouth/Throat:  ?   Pharynx: Posterior oropharyngeal erythema present. No pharyngeal swelling, oropharyngeal exudate or uvula swelling.  ?   Tonsils: No tonsillar exudate or tonsillar abscesses.  ? ?   Comments: Ulcerated lesion present to left upper mouth and right tonsil. ?Eyes:  ?   Extraocular Movements: Extraocular movements intact.  ?   Conjunctiva/sclera: Conjunctivae normal.  ?Cardiovascular:  ?   Rate and Rhythm: Normal rate and regular rhythm.  ?   Pulses: Normal pulses.  ?   Heart sounds: Normal heart  sounds.  ?  Pulmonary:  ?   Effort: Pulmonary effort is normal. No respiratory distress.  ?   Breath sounds: Normal breath sounds.  ?Neurological:  ?   General: No focal deficit present.  ?   Mental Status: She is alert and oriented to person, place, and time. Mental status is at baseline.  ?Psychiatric:     ?   Mood and Affect: Mood normal.     ?   Behavior: Behavior normal.     ?   Thought Content: Thought content normal.     ?   Judgment: Judgment normal.  ? ? ? ?UC Treatments / Results  ?Labs ?(all labs ordered are listed, but only abnormal results are displayed) ?Labs Reviewed  ?CULTURE, GROUP A STREP [facility])  ?POCT RAPID STREP A (OFFICE)  ?POCT MONO SCREEN [facility])  ? ? ?EKG ? ? ?Radiology ?No results found. ? ?Procedures ?Procedures (including critical care time) ? ?Medications Ordered in UC ?Medications - No data to display ? ?Initial Impression / Assessment and Plan / UC Course  ?I have reviewed the triage vital signs and the nursing notes. ? ?Pertinent labs & imaging results that were available during my care of the patient were reviewed by me and considered in my medical decision making (see chart for details). ? ?  ? ?Rapid strep was negative.  Throat culture pending.  Rapid mono negative.  It appears the patient has aphthous ulcers present to mouth.  Will treat with Magic mouthwash.  Patient to follow-up if symptoms persist or worsen.  Discussed return precautions.  Patient verbalized understanding and was agreeable with plan. ?Final Clinical Impressions(s) / UC Diagnoses  ? ?Final diagnoses:  ?Aphthous ulcer  ?Sore throat  ? ? ? ?Discharge Instructions   ? ?  ?Strep and monotest were negative.  Throat culture is pending.  We will call if it is positive.  It appears that you have ulcers in your mouth which is being treated with magic mouthwash that you will swish and spit.  They should resolve on their own.  Please follow-up if symptoms persist or worsen. ? ? ? ?ED Prescriptions   ? ? Medication Sig Dispense  Auth. Provider  ? magic mouthwash (nystatin, lidocaine, diphenhydrAMINE) suspension Take 5 mLs by mouth 4 (four) times daily as needed for mouth pain. Swish and spit 180 mL Teodora Medici, [place]  ? ?  ? ?PDMP not r

## 2021-08-14 LAB — CULTURE, GROUP A STREP (THRC)

## 2021-10-04 ENCOUNTER — Ambulatory Visit
Admission: RE | Admit: 2021-10-04 | Discharge: 2021-10-04 | Disposition: A | Discharge status: Home / Self Care | Payer: Medicaid Other | Source: Ambulatory Visit | Attending: Family Medicine | Admitting: Family Medicine

## 2021-10-04 VITALS — BP 113/80 | HR 88 | Temp 98.0°F | Resp 18

## 2021-10-04 DIAGNOSIS — N3090 Cystitis, unspecified without hematuria: Secondary | ICD-10-CM | POA: Diagnosis present

## 2021-10-04 LAB — POCT URINALYSIS DIP (MANUAL ENTRY)
Bilirubin, UA: NEGATIVE
Glucose, UA: NEGATIVE mg/dL
Ketones, POC UA: NEGATIVE mg/dL
Nitrite, UA: POSITIVE — AB
Protein Ur, POC: NEGATIVE mg/dL
Spec Grav, UA: <=1.005 — AB (ref 1.010–1.025)
Urobilinogen, UA: 0.2 E.U./dL
pH, UA: 7 (ref 5.0–8.0)

## 2021-10-04 MED ORDER — CIPROFLOXACIN HCL 500 MG PO TABS: 500.0000 mg | ORAL_TABLET | Freq: Two times a day (BID) | ORAL | 0 refills | Status: AC

## 2021-10-04 NOTE — ED Triage Notes (Signed)
Pt c/o dysuria, frequency, oliguria, onset ~ 2 days.

## 2021-10-04 NOTE — Discharge Instructions (Addendum)
Your urinalysis showed some white blood cells and some nitrites.  It is consistent with probable UTI.  Take Cipro 500 mg--1 tablet 2 times daily for 7 days

## 2021-10-04 NOTE — ED Provider Notes (Signed)
[facility]    CSN: [id number] Arrival date & time: 10/04/21  1626      History   Chief Complaint Chief Complaint  Patient presents with   Urinary Frequency    have a uti and can't get my gynecologist to call me back - Entered by patient    HPI Deborah Simpson is a 31 y.o. female.    Urinary Frequency   Here for urinary frequency, incomplete bladder emptying, and dysuria.  This has been going on since June 25.  No fever or vomiting.   She has an IUD, so she is uncertain of when her last menstrual cycle was  Past Medical History:  Diagnosis Date   Abnormal pap 17/61   CIN 1   Complication of anesthesia    Medical history non-contributory    PONV (postoperative nausea and vomiting)     Patient Active Problem List   Diagnosis Date Noted   Overweight (BMI 25.0-29.9) 04/01/2018   Family history of breast cancer in mother 01/27/2016   ADD (attention deficit disorder) 07/12/2015    Past Surgical History:  Procedure Laterality Date   DILATION AND EVACUATION N/A 02/18/2017   Procedure: DILATATION AND EVACUATION;  Surgeon: Sanjuana Kava, MD;  Location: [facility];  Service: Gynecology;  Laterality: N/A;   NO PAST SURGERIES     WISDOM TOOTH EXTRACTION N/A at 31 yo    OB History     Gravida  2   Para  1   Term  1   Preterm      AB  1   Living  1      SAB      IAB  1   Ectopic      Multiple  0   Live Births  1            Home Medications    Prior to Admission medications   Medication Sig Start Date End Date Taking? Authorizing Provider  ciprofloxacin (CIPRO) 500 MG tablet Take 1 tablet (500 mg total) by mouth 2 (two) times daily for 7 days. 10/04/21 10/11/21 Yes Mella Inclan, Gwenlyn Perking, MD    Family History Family History  Problem Relation Age of Onset   Breast cancer Mother 75   Diabetes Father     Social History Social History   Tobacco Use   Smoking status: Never   Smokeless tobacco: Never  Substance Use Topics   Alcohol  use: No   Drug use: No     Allergies   Patient has no known allergies.   Review of Systems Review of Systems  Genitourinary:  Positive for frequency.     Physical Exam Triage Vital Signs ED Triage Vitals [10/04/21 1641]  Enc Vitals Group     BP 113/80     Pulse Rate 88     Resp 18     Temp 98 F (36.7 C)     Temp Source Oral     SpO2 98 %     Weight      Height      Head Circumference      Peak Flow      Pain Score 0     Pain Loc      Pain Edu?      Excl. in [place]?    No data found.  Updated Vital Signs BP 113/80 (BP Location: Left Arm)   Pulse 88   Temp 98 F (36.7 C) (Oral)   Resp 18  SpO2 98%   Visual Acuity Right Eye Distance:   Left Eye Distance:   Bilateral Distance:    Right Eye Near:   Left Eye Near:    Bilateral Near:     Physical Exam Vitals reviewed.  Constitutional:      General: She is not in acute distress.    Appearance: She is not ill-appearing, toxic-appearing or diaphoretic.  HENT:     Mouth/Throat:     Mouth: Mucous membranes are moist.  Eyes:     Extraocular Movements: Extraocular movements intact.     Conjunctiva/sclera: Conjunctivae normal.     Pupils: Pupils are equal, round, and reactive to light.  Cardiovascular:     Rate and Rhythm: Normal rate and regular rhythm.  Pulmonary:     Breath sounds: Normal breath sounds.  Abdominal:     General: There is no distension.     Palpations: Abdomen is soft.     Tenderness: There is no abdominal tenderness. There is no guarding.  Musculoskeletal:     Cervical back: Neck supple.  Lymphadenopathy:     Cervical: No cervical adenopathy.  Skin:    Coloration: Skin is not jaundiced or pale.  Neurological:     General: No focal deficit present.     Mental Status: She is alert and oriented to person, place, and time.      UC Treatments / Results  Labs (all labs ordered are listed, but only abnormal results are displayed) Labs Reviewed  POCT URINALYSIS DIP (MANUAL ENTRY) -  Abnormal; Notable for the following components:      Result Value   Spec Grav, UA <=1.005 (*)    Blood, UA small (*)    Nitrite, UA Positive (*)    Leukocytes, UA Small (1+) (*)    All other components within normal limits  URINE CULTURE    EKG   Radiology No results found.  Procedures Procedures (including critical care time)  Medications Ordered in UC Medications - No data to display  Initial Impression / Assessment and Plan / UC Course  I have reviewed the triage vital signs and the nursing notes.  Pertinent labs & imaging results that were available during my care of the patient were reviewed by me and considered in my medical decision making (see chart for details).     Her urinalysis shows small amount of leukocytes and is positive for nitrites.  I will treat with Cipro for cystitis, and culture the urine Final Clinical Impressions(s) / UC Diagnoses   Final diagnoses:  Cystitis     Discharge Instructions      Your urinalysis showed some white blood cells and some nitrites.  It is consistent with probable UTI.  Take Cipro 500 mg--1 tablet 2 times daily for 7 days     ED Prescriptions     Medication Sig Dispense Auth. Provider   ciprofloxacin (CIPRO) 500 MG tablet Take 1 tablet (500 mg total) by mouth 2 (two) times daily for 7 days. 14 tablet Shakirah Kirkey, Gwenlyn Perking, MD      PDMP not reviewed this encounter.   Barrett Henle, MD 10/04/21 [phone]

## 2021-10-06 LAB — URINE CULTURE: Culture: 50000 — AB

## 2021-12-29 ENCOUNTER — Ambulatory Visit
Admission: RE | Admit: 2021-12-29 | Discharge: 2021-12-29 | Disposition: A | Discharge status: Home / Self Care | Payer: Medicaid Other | Source: Ambulatory Visit

## 2021-12-29 VITALS — BP 141/92 | HR 85 | Temp 98.5°F | Resp 18

## 2021-12-29 DIAGNOSIS — H04203 Unspecified epiphora, bilateral lacrimal glands: Secondary | ICD-10-CM

## 2021-12-29 NOTE — ED Provider Notes (Signed)
[facility]    CSN: [id number] Arrival date & time: 12/29/21  1606      History   Chief Complaint Chief Complaint  Patient presents with   Eye Problem    eye constantly tears all day been going on for a couple weeks - Entered by patient    HPI Deborah Simpson is a 31 y.o. female.   Patient presents with bilateral watery eyes that has been present for about 3 weeks.  Denies any pain or itchiness to the eyes.  Patient reports right eye is worse than the left.  Denies trauma, foreign body, crustiness to the eyes.  Denies any associated upper respiratory symptoms.  Denies any fever.  Patient does not wear contacts or glasses.  Denies blurry vision.   Eye Problem   Past Medical History:  Diagnosis Date   Abnormal pap 97/98   CIN 1   Complication of anesthesia    Medical history non-contributory    PONV (postoperative nausea and vomiting)     Patient Active Problem List   Diagnosis Date Noted   Overweight (BMI 25.0-29.9) 04/01/2018   Family history of breast cancer in mother 01/27/2016   ADD (attention deficit disorder) 07/12/2015    Past Surgical History:  Procedure Laterality Date   DILATION AND EVACUATION N/A 02/18/2017   Procedure: DILATATION AND EVACUATION;  Surgeon: Sanjuana Kava, MD;  Location: [facility];  Service: Gynecology;  Laterality: N/A;   NO PAST SURGERIES     WISDOM TOOTH EXTRACTION N/A at 31 yo    OB History     Gravida  2   Para  1   Term  1   Preterm      AB  1   Living  1      SAB      IAB  1   Ectopic      Multiple  0   Live Births  1            Home Medications    Prior to Admission medications   Not on File    Family History Family History  Problem Relation Age of Onset   Breast cancer Mother 74   Diabetes Father     Social History Social History   Tobacco Use   Smoking status: Never   Smokeless tobacco: Never  Substance Use Topics   Alcohol use: No   Drug use: No     Allergies    Patient has no known allergies.   Review of Systems Review of Systems Per HPI  Physical Exam Triage Vital Signs ED Triage Vitals  Enc Vitals Group     BP 12/29/21 1631 (!) 141/92     Pulse Rate 12/29/21 1631 85     Resp 12/29/21 1631 18     Temp 12/29/21 1631 98.5 F (36.9 C)     Temp src --      SpO2 12/29/21 1631 95 %     Weight --      Height --      Head Circumference --      Peak Flow --      Pain Score 12/29/21 1628 0     Pain Loc --      Pain Edu? --      Excl. in [place]? --    No data found.  Updated Vital Signs BP (!) 141/92   Pulse 85   Temp 98.5 F (36.9 C)   Resp 18   SpO2  95%   Visual Acuity Right Eye Distance:   Left Eye Distance:   Bilateral Distance:    Right Eye Near:   Left Eye Near:    Bilateral Near:     Physical Exam Constitutional:      General: She is not in acute distress.    Appearance: Normal appearance. She is not toxic-appearing or diaphoretic.  HENT:     Head: Normocephalic and atraumatic.  Eyes:     General: Lids are normal. Lids are everted, no foreign bodies appreciated. Vision grossly intact. Gaze aligned appropriately.     Extraocular Movements: Extraocular movements intact.     Conjunctiva/sclera: Conjunctivae normal.     Pupils: Pupils are equal, round, and reactive to light.     Comments: Watery drainage to right eye.  No redness noted.  Pulmonary:     Effort: Pulmonary effort is normal.  Neurological:     General: No focal deficit present.     Mental Status: She is alert and oriented to person, place, and time. Mental status is at baseline.  Psychiatric:        Mood and Affect: Mood normal.        Behavior: Behavior normal.        Thought Content: Thought content normal.        Judgment: Judgment normal.      UC Treatments / Results  Labs (all labs ordered are listed, but only abnormal results are displayed) Labs Reviewed - No data to display  EKG   Radiology No results  found.  Procedures Procedures (including critical care time)  Medications Ordered in UC Medications - No data to display  Initial Impression / Assessment and Plan / UC Course  I have reviewed the triage vital signs and the nursing notes.  Pertinent labs & imaging results that were available during my care of the patient were reviewed by me and considered in my medical decision making (see chart for details).     Patient has watery drainage to right eye on physical exam but no other obvious abnormalities.  No signs of bacterial or infection on exam.  Suspect the patient could have dry eye and eyes may be compensating for it.  Advised patient to use artificial tears over-the-counter and to follow-up with eye doctor at provided contact information for further evaluation and management.  Visual acuity appears normal.  Patient verbalized understanding and was agreeable with plan. Final Clinical Impressions(s) / UC Diagnoses   Final diagnoses:  Watery eyes     Discharge Instructions      You could have dry eye.  Recommend that you use artificial tears over-the-counter.  Follow-up with eye doctor at provided contact information if symptoms persist or worsen.    ED Prescriptions   None    PDMP not reviewed this encounter.   Teodora Medici, [place] 12/29/21 [phone]

## 2021-12-29 NOTE — ED Triage Notes (Signed)
Pt presents to uc with co of right eye drainage for 3 weeks. Pt reports no pain has attempted no make up/eye drops/ allergy medication with no improvement

## 2021-12-29 NOTE — Discharge Instructions (Signed)
You could have dry eye.  Recommend that you use artificial tears over-the-counter.  Follow-up with eye doctor at provided contact information if symptoms persist or worsen.

## 2022-04-25 ENCOUNTER — Ambulatory Visit (INDEPENDENT_AMBULATORY_CARE_PROVIDER_SITE_OTHER): Payer: Medicaid Other | Admitting: Physician Assistant

## 2022-04-25 ENCOUNTER — Encounter: Payer: Self-pay | Admitting: Physician Assistant

## 2022-04-25 VITALS — BP 120/72 | HR 98 | Temp 97.5°F | Ht 68.0 in | Wt 188.8 lb

## 2022-04-25 DIAGNOSIS — Z803 Family history of malignant neoplasm of breast: Secondary | ICD-10-CM | POA: Diagnosis not present

## 2022-04-25 DIAGNOSIS — D485 Neoplasm of uncertain behavior of skin: Secondary | ICD-10-CM

## 2022-04-25 DIAGNOSIS — M4317 Spondylolisthesis, lumbosacral region: Secondary | ICD-10-CM

## 2022-04-25 NOTE — Patient Instructions (Signed)
Welcome to [facility]! It was a pleasure meeting you today.  As discussed, Please schedule a 6 month follow up visit today.  Referral to Dermatology  PLEASE NOTE:  If you had any LAB tests please let us know if you have not heard back within a few days. You may see your results on MyChart before we have a chance to review them but we will give you a call once they are reviewed by [place]. If we ordered any REFERRALS today, please let us know if you have not heard from their office within the next two weeks. Let us know through MyChart if you are needing REFILLS, or have your pharmacy send [place] the request. You can also use MyChart to communicate with me or any office staff.  Please try these tips to maintain a healthy lifestyle:  Eat most of your calories during the day when you are active. Eliminate processed foods including packaged sweets (pies, cakes, cookies), reduce intake of potatoes, white bread, white pasta, and white rice. Look for whole grain options, oat flour or almond flour.  Each meal should contain half fruits/vegetables, one quarter protein, and one quarter carbs (no bigger than a computer mouse).  Cut down on sweet beverages. This includes juice, soda, and sweet tea. Also watch fruit intake, though this is a healthier sweet option, it still contains natural sugar! Limit to 3 servings daily.  Drink at least 1 glass of water with each meal and aim for at least 8 glasses (64 ounces) per day.  Exercise at least 150 minutes every week to the best of your ability.    Take Care,  Maribelle Hopple, PA-C

## 2022-04-25 NOTE — Assessment & Plan Note (Signed)
Patient has had her first mammogram.  She follows with gynecology.

## 2022-04-25 NOTE — Assessment & Plan Note (Signed)
Patient is currently seeing pain management and a chiropractor.  Feels like the chiropractor is helping more than anything and hopes to get off pain medicine, or to take it as little as possible.  Currently taking oxycodone 15 mg as needed for severe pain.  History of fall from horse in 2020.

## 2022-04-25 NOTE — Assessment & Plan Note (Signed)
Reassured patient that left shoulder and left scalp lesions were benign.  Most likely lesion on the right side of face is a melanocytic nevi, also benign, but would appreciate dermatology to take a look and also do a full skin exam.  Referral is placed for her today.

## 2022-04-25 NOTE — Progress Notes (Signed)
Subjective:    Patient ID: Deborah Simpson, female    DOB: 09-16-1990, 32 y.o.   MRN: [id number]  Chief Complaint  Patient presents with   New Patient (Initial Visit)    Pt here to have some moles looked at and referral     HPI 32 y.o. patient presents today for new patient establishment with me.  Patient hasn't had PCP in awhile. Working full-time; married; 49 yo daughter, Martie Round.   Current Care Team: Pain management GYN - [place]   Concerns: Has some moles she's concerned about -scalp, R side of face, L shoulder   Past Medical History:  Diagnosis Date   Abnormal pap 82/99   CIN 1   Complication of anesthesia    Medical history non-contributory    PONV (postoperative nausea and vomiting)     Past Surgical History:  Procedure Laterality Date   DILATION AND EVACUATION N/A 02/18/2017   Procedure: DILATATION AND EVACUATION;  Surgeon: Sanjuana Kava, MD;  Location: [facility];  Service: Gynecology;  Laterality: N/A;   NO PAST SURGERIES     WISDOM TOOTH EXTRACTION N/A at 32 yo    Family History  Problem Relation Age of Onset   Breast cancer Mother 14   Diabetes Father     Social History   Tobacco Use   Smoking status: Never   Smokeless tobacco: Never  Substance Use Topics   Alcohol use: No   Drug use: No     No Known Allergies  Review of Systems NEGATIVE UNLESS OTHERWISE INDICATED IN HPI      Objective:     BP 120/72 (BP Location: Left Arm, Patient Position: Sitting)   Pulse 98   Temp (!) 97.5 F (36.4 C) (Temporal)   Ht '5\' 8"'$  (1.727 m)   Wt 188 lb 12.8 oz (85.6 kg)   SpO2 96%   BMI 28.71 kg/m   Wt Readings from Last 3 Encounters:  04/25/22 188 lb 12.8 oz (85.6 kg)  10/04/17 173 lb (78.5 kg)  02/18/17 167 lb (75.8 kg)    BP Readings from Last 3 Encounters:  04/25/22 120/72  12/29/21 (!) 141/92  10/04/21 113/80     Physical Exam Vitals and nursing note reviewed.  Constitutional:      Appearance: Normal appearance. She is normal  weight. She is not toxic-appearing.  HENT:     Head: Normocephalic and atraumatic.     Right Ear: Tympanic membrane, ear canal and external ear normal.     Left Ear: Tympanic membrane, ear canal and external ear normal.     Nose: Nose normal.     Mouth/Throat:     Mouth: Mucous membranes are moist.  Eyes:     Extraocular Movements: Extraocular movements intact.     Conjunctiva/sclera: Conjunctivae normal.     Pupils: Pupils are equal, round, and reactive to light.  Cardiovascular:     Rate and Rhythm: Normal rate and regular rhythm.     Pulses: Normal pulses.     Heart sounds: Normal heart sounds.  Pulmonary:     Effort: Pulmonary effort is normal.     Breath sounds: Normal breath sounds.  Musculoskeletal:        General: Normal range of motion.     Cervical back: Normal range of motion and neck supple.     Right lower leg: No edema.     Left lower leg: No edema.  Skin:    General: Skin is warm and dry.  Findings: Lesion (benign nevi noted in L scalp and left shoulder; R cheek raised lesion mostly flesh colored with some areas of discoloration) present.  Neurological:     General: No focal deficit present.     Mental Status: She is alert and oriented to person, place, and time.  Psychiatric:        Mood and Affect: Mood normal.        Behavior: Behavior normal.        Assessment & Plan:  Spondylolisthesis at L5-S1 level Assessment & Plan: Patient is currently seeing pain management and a chiropractor.  Feels like the chiropractor is helping more than anything and hopes to get off pain medicine, or to take it as little as possible.  Currently taking oxycodone 15 mg as needed for severe pain.  History of fall from horse in 2020.   Neoplasm of uncertain behavior of skin Assessment & Plan: Reassured patient that left shoulder and left scalp lesions were benign.  Most likely lesion on the right side of face is a melanocytic nevi, also benign, but would appreciate  dermatology to take a look and also do a full skin exam.  Referral is placed for her today.  Orders: -     Ambulatory referral to Dermatology  Family history of breast cancer in mother Assessment & Plan: Patient has had her first mammogram.  She follows with gynecology.      Return in about 6 months (around 10/24/2022) for CPE, fasting labs .  This note was prepared with assistance of Systems analyst. Occasional wrong-word or sound-a-like substitutions may have occurred due to the inherent limitations of voice recognition software.  Time Spent: 31 minutes of total time was spent on the date of the encounter performing the following actions: chart review prior to seeing the patient, obtaining history, performing a medically necessary exam, counseling on the treatment plan, placing orders, and documenting in our EHR.       Rei Contee M Jaking Thayer, PA-C

## 2022-04-30 ENCOUNTER — Telehealth: Payer: Self-pay | Admitting: Physician Assistant

## 2022-04-30 NOTE — Telephone Encounter (Signed)
The referral sent to the Dermatology office does not take pts insurance. Please send referral to: [place] - Phone [phone].

## 2022-04-30 NOTE — Telephone Encounter (Signed)
Please see msg, also sent inbasket message in referral

## 2022-06-12 ENCOUNTER — Ambulatory Visit
Admission: RE | Admit: 2022-06-12 | Discharge: 2022-06-12 | Disposition: A | Discharge status: Home / Self Care | Payer: Medicaid Other | Source: Ambulatory Visit | Attending: Internal Medicine | Admitting: Internal Medicine

## 2022-06-12 VITALS — BP 123/83 | HR 96 | Temp 98.5°F | Resp 18

## 2022-06-12 DIAGNOSIS — J020 Streptococcal pharyngitis: Secondary | ICD-10-CM | POA: Diagnosis not present

## 2022-06-12 LAB — POCT RAPID STREP A (OFFICE): Rapid Strep A Screen: POSITIVE — AB

## 2022-06-12 MED ORDER — AMOXICILLIN 500 MG PO CAPS: 500.0000 mg | ORAL_CAPSULE | Freq: Two times a day (BID) | ORAL | 0 refills | Status: DC

## 2022-06-12 NOTE — ED Provider Notes (Signed)
[facility]    CSN: [id number] Arrival date & time: 06/12/22  1221      History   Chief Complaint Chief Complaint  Patient presents with   Sore Throat    thinking I may have strep throat, very serve sore throat with fever chills and extreme headache - Entered by patient   Headache    HPI Deborah Simpson is a 32 y.o. female.   Patient presents with sore throat, nasal congestion, and headache that started about 2 days ago.  Patient has taken NyQuil for symptoms with minimal improvement.  Denies cough, chest pain, shortness of breath, nausea, vomiting, diarrhea, abdominal pain.  Patient reports that she has had chills and bodyaches but denies any documented fever.  Denies any known sick contacts.   Sore Throat  Headache   Past Medical History:  Diagnosis Date   Abnormal pap [id number]   CIN 1   Complication of anesthesia    Medical history non-contributory    PONV (postoperative nausea and vomiting)     Patient Active Problem List   Diagnosis Date Noted   Spondylolisthesis at L5-S1 level 04/25/2022   Neoplasm of uncertain behavior of skin 04/25/2022   Overweight (BMI 25.0-29.9) 04/01/2018   Family history of breast cancer in mother 01/27/2016   ADD (attention deficit disorder) 07/12/2015    Past Surgical History:  Procedure Laterality Date   DILATION AND EVACUATION N/A 02/18/2017   Procedure: DILATATION AND EVACUATION;  Surgeon: Sanjuana Kava, MD;  Location: [facility];  Service: Gynecology;  Laterality: N/A;   NO PAST SURGERIES     WISDOM TOOTH EXTRACTION N/A at 32 yo    OB History     Gravida  2   Para  1   Term  1   Preterm      AB  1   Living  1      SAB      IAB  1   Ectopic      Multiple  0   Live Births  1            Home Medications    Prior to Admission medications   Medication Sig Start Date End Date Taking? Authorizing Provider  amoxicillin (AMOXIL) 500 MG capsule Take 1 capsule (500 mg total) by mouth 2 (two) times  daily for 10 days. 06/12/22 06/22/22 Yes Jlynn Langille, Michele Rockers, FNP  levonorgestrel (MIRENA, 52 MG,) 20 MCG/DAY IUD Take 1 device by intrauterine route. 04/13/17  Yes [provider]  oxyCODONE (ROXICODONE) 15 MG immediate release tablet Take 15 mg by mouth.    [provider]    Family History Family History  Problem Relation Age of Onset   Breast cancer Mother 79   Diabetes Father     Social History Social History   Tobacco Use   Smoking status: Never   Smokeless tobacco: Never  Substance Use Topics   Alcohol use: No   Drug use: No     Allergies   Patient has no known allergies.   Review of Systems Review of Systems Per HPI  Physical Exam Triage Vital Signs ED Triage Vitals [06/12/22 1225]  Enc Vitals Group     BP 123/83     Pulse Rate (!) 104     Resp 18     Temp 98.5 F (36.9 C)     Temp Source Oral     SpO2 98 %     Weight      Height  Head Circumference      Peak Flow      Pain Score      Pain Loc      Pain Edu?      Excl. in [place]?    No data found.  Updated Vital Signs BP 123/83 (BP Location: Left Arm)   Pulse 96   Temp 98.5 F (36.9 C) (Oral)   Resp 18   SpO2 98%   Visual Acuity Right Eye Distance:   Left Eye Distance:   Bilateral Distance:    Right Eye Near:   Left Eye Near:    Bilateral Near:     Physical Exam Constitutional:      General: She is not in acute distress.    Appearance: Normal appearance. She is not toxic-appearing or diaphoretic.  HENT:     Head: Normocephalic and atraumatic.     Right Ear: Tympanic membrane and ear canal normal.     Left Ear: Tympanic membrane and ear canal normal.     Nose: Congestion present.     Mouth/Throat:     Mouth: Mucous membranes are moist.     Pharynx: Posterior oropharyngeal erythema present. No oropharyngeal exudate.     Tonsils: Tonsillar exudate present. No tonsillar abscesses.     Comments: Exudate on the right tonsil. Eyes:     Extraocular Movements: Extraocular  movements intact.     Conjunctiva/sclera: Conjunctivae normal.     Pupils: Pupils are equal, round, and reactive to light.  Cardiovascular:     Rate and Rhythm: Normal rate and regular rhythm.     Pulses: Normal pulses.     Heart sounds: Normal heart sounds.  Pulmonary:     Effort: Pulmonary effort is normal. No respiratory distress.     Breath sounds: Normal breath sounds. No stridor. No wheezing, rhonchi or rales.  Abdominal:     General: Abdomen is flat. Bowel sounds are normal.     Palpations: Abdomen is soft.  Musculoskeletal:        General: Normal range of motion.     Cervical back: Normal range of motion.  Skin:    General: Skin is warm and dry.  Neurological:     General: No focal deficit present.     Mental Status: She is alert and oriented to person, place, and time. Mental status is at baseline.  Psychiatric:        Mood and Affect: Mood normal.        Behavior: Behavior normal.      UC Treatments / Results  Labs (all labs ordered are listed, but only abnormal results are displayed) Labs Reviewed  POCT RAPID STREP A (OFFICE) - Abnormal; Notable for the following components:      Result Value   Rapid Strep A Screen Positive (*)    All other components within normal limits    EKG   Radiology No results found.  Procedures Procedures (including critical care time)  Medications Ordered in UC Medications - No data to display  Initial Impression / Assessment and Plan / UC Course  I have reviewed the triage vital signs and the nursing notes.  Pertinent labs & imaging results that were available during my care of the patient were reviewed by me and considered in my medical decision making (see chart for details).     Rapid strep was positive.  Will treat with amoxicillin antibiotic.  There is no sign of peritonsillar abscess on exam.  Advised supportive care and symptom  management.  Discussed return precautions.  Patient verbalized understanding and was  agreeable with plan. Final Clinical Impressions(s) / UC Diagnoses   Final diagnoses:  Strep pharyngitis     Discharge Instructions      You have strep throat which is being treated with an antibiotic.  Please change toothbrush on day 3-4 of antibiotic to prevent reinfection.  Recommend 24 hours on antibiotic before returning to work.  Follow-up if any symptoms persist or worsen.    ED Prescriptions     Medication Sig Dispense Auth. Provider   amoxicillin (AMOXIL) 500 MG capsule Take 1 capsule (500 mg total) by mouth 2 (two) times daily for 10 days. 20 capsule Teodora Medici, [place]      PDMP not reviewed this encounter.   Teodora Medici, [place] 06/12/22 1245

## 2022-06-12 NOTE — Discharge Instructions (Signed)
You have strep throat which is being treated with an antibiotic.  Please change toothbrush on day 3-4 of antibiotic to prevent reinfection.  Recommend 24 hours on antibiotic before returning to work.  Follow-up if any symptoms persist or worsen.

## 2022-06-12 NOTE — ED Triage Notes (Signed)
Pt states sore throat and headache x 2 days. Pt is taking Nyquil. Pt would like to be tested for Strep.

## 2022-06-14 ENCOUNTER — Telehealth: Payer: Self-pay | Admitting: Family Medicine

## 2022-06-14 MED ORDER — AZITHROMYCIN 250 MG PO TABS: ORAL_TABLET | ORAL | 0 refills | Status: DC

## 2022-06-14 NOTE — Telephone Encounter (Signed)
Pt called stating that she has developed a rash to the amoxicillin rx'd at her visit 3/5. She has stopped taking it and the rash is better.  Zpack is sent in as an alternative, and chart now labelled allergic to amoxil

## 2022-10-23 ENCOUNTER — Encounter: Payer: Medicaid Other | Admitting: Physician Assistant

## 2022-10-31 ENCOUNTER — Telehealth: Payer: Self-pay | Admitting: Physician Assistant

## 2022-10-31 NOTE — Telephone Encounter (Signed)
FYI: This call has been transferred to triage nurse: the Triage Nurse. Once the result note has been entered staff can address the message at that time.  Patient called in with the following symptoms:  Red Word:chest pain for the past 2 weeks   Please advise at Mobile [phone] (mobile)  Message is routed to Provider Pool.

## 2022-10-31 NOTE — Telephone Encounter (Signed)
Final outcome: See PCP within 3 days. Patient has been scheduled with PCP for 7/25 @ 1 pm.    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 Translation No Nurse Assessment Nurse: Annye English, RN, Denise Date/Time (Eastern Time): 10/31/2022 2:21:46 PM Confirm and document reason for call. If symptomatic, describe symptoms. ---Pt c/o intermittent CP and DENIES CP at this time. Does the patient have any new or worsening symptoms? ---Yes Will a triage be completed? ---Yes Related visit to physician within the last 2 weeks? ---No Does the PT have any chronic conditions? (i.e. diabetes, asthma, this includes High risk factors for pregnancy, etc.) ---No Is the patient pregnant or possibly pregnant? (Ask all females between the ages of 3-55) ---No Is this a behavioral health or substance abuse call? ---No Guidelines Guideline Title Affirmed Question Affirmed Notes Nurse Date/Time (Eastern Time) Chest Pain [1] Chest pain from known angina comes and goes AND [2] is NOT happening more often (increasing in frequency) or getting worse (increasing in severity) Carmon, RN, Angelique Blonder 10/31/2022 2:22:29 PM Disp. Time Lamount Cohen Time) Disposition Final User 10/31/2022 2:20:19 PM Send to Urgent Conni Elliot 10/31/2022 2:26:31 PM SEE PCP WITHIN 3 DAYS Yes  Carmon, RN, Angelique Blonder Final Disposition 10/31/2022 2:26:31 PM SEE PCP WITHIN 3 DAYS Yes Carmon, RN, Leighton Ruff Disagree/Comply Comply Caller Understands Yes PreDisposition Call Doctor Care Advice Given Per Guideline SEE PCP WITHIN 3 DAYS: BRING MEDICINES: * Please bring a list of your current medicines when you go to see the doctor. CALL BACK IF: * Chest pain lasts over 5 minutes * You become worse CARE ADVICE given per Chest Pain (Adult) guideline.

## 2022-11-01 ENCOUNTER — Ambulatory Visit: Payer: Medicaid Other | Admitting: Physician Assistant

## 2022-11-01 NOTE — Telephone Encounter (Signed)
See note

## 2022-11-02 ENCOUNTER — Ambulatory Visit
Admission: EM | Admit: 2022-11-02 | Discharge: 2022-11-02 | Disposition: A | Discharge status: Home / Self Care | Payer: Medicaid Other | Attending: Physician Assistant | Admitting: Physician Assistant

## 2022-11-02 DIAGNOSIS — R079 Chest pain, unspecified: Secondary | ICD-10-CM | POA: Diagnosis not present

## 2022-11-02 NOTE — ED Triage Notes (Signed)
Pt states CP on and off for the past 3 weeks.  States sometimes its in the middle of her chest ans sometimes in her left arm and back. States she takes ibuprofen for it at home with relief.

## 2022-11-02 NOTE — ED Provider Notes (Signed)
[facility]    CSN: [id number] Arrival date & time: 11/02/22  1011      History   Chief Complaint Chief Complaint  Patient presents with   Chest Pain    HPI Deborah Simpson is a 32 y.o. female.   Pt complains of chest pain that has been on and off for the past 3 weeks.  Pt reports pain on the left side of her chest.  Pt has not had any shortness of breath. No fever or cough.   The history is provided by the patient. No language interpreter was used.  Chest Pain Pain location:  L chest Pain quality: aching   Pain radiates to:  Does not radiate Pain severity:  Moderate Duration:  3 weeks Timing:  Constant Chronicity:  New Relieved by:  Nothing Worsened by:  Nothing Ineffective treatments:  None tried   Past Medical History:  Diagnosis Date   Abnormal pap 09/08   CIN 1   Complication of anesthesia    Medical history non-contributory    PONV (postoperative nausea and vomiting)     Patient Active Problem List   Diagnosis Date Noted   Spondylolisthesis at L5-S1 level 04/25/2022   Neoplasm of uncertain behavior of skin 04/25/2022   Overweight (BMI 25.0-29.9) 04/01/2018   Family history of breast cancer in mother 01/27/2016   ADD (attention deficit disorder) 07/12/2015    Past Surgical History:  Procedure Laterality Date   DILATION AND EVACUATION N/A 02/18/2017   Procedure: DILATATION AND EVACUATION;  Surgeon: Essie Hart, MD;  Location: [facility];  Service: Gynecology;  Laterality: N/A;   NO PAST SURGERIES     WISDOM TOOTH EXTRACTION N/A at 32 yo    OB History     Gravida  2   Para  1   Term  1   Preterm      AB  1   Living  1      SAB      IAB  1   Ectopic      Multiple  0   Live Births  1            Home Medications    Prior to Admission medications   Medication Sig Start Date End Date Taking? Authorizing Provider  azithromycin (ZITHROMAX) 250 MG tablet Take first 2 tablets together, then 1 every day until  finished. 06/14/22   Zenia Resides, MD  levonorgestrel (MIRENA, 52 MG,) 20 MCG/DAY IUD Take 1 device by intrauterine route. 04/13/17   [provider]  oxyCODONE (ROXICODONE) 15 MG immediate release tablet Take 15 mg by mouth.    [provider]    Family History Family History  Problem Relation Age of Onset   Breast cancer Mother 96   Diabetes Father     Social History Social History   Tobacco Use   Smoking status: Never   Smokeless tobacco: Never  Substance Use Topics   Alcohol use: No   Drug use: No     Allergies   Amoxicillin   Review of Systems Review of Systems  Cardiovascular:  Positive for chest pain.  All other systems reviewed and are negative.    Physical Exam Triage Vital Signs ED Triage Vitals [11/02/22 1020]  Encounter Vitals Group     BP (!) 145/95     Systolic BP Percentile      Diastolic BP Percentile      Pulse Rate 82     Resp 16  Temp 98.9 F (37.2 C)     Temp Source Oral     SpO2 97 %     Weight      Height      Head Circumference      Peak Flow      Pain Score 4     Pain Loc      Pain Education      Exclude from Growth Chart    No data found.  Updated Vital Signs BP (!) 145/95 (BP Location: Left Arm)   Pulse 82   Temp 98.9 F (37.2 C) (Oral)   Resp 16   SpO2 97%   Visual Acuity Right Eye Distance:   Left Eye Distance:   Bilateral Distance:    Right Eye Near:   Left Eye Near:    Bilateral Near:     Physical Exam Vitals and nursing note reviewed.  Constitutional:      Appearance: She is well-developed.  HENT:     Head: Normocephalic.  Cardiovascular:     Heart sounds: Normal heart sounds.  Pulmonary:     Effort: Pulmonary effort is normal.  Abdominal:     General: There is no distension.  Musculoskeletal:        General: Normal range of motion.     Cervical back: Normal range of motion.  Neurological:     General: No focal deficit present.     Mental Status: She is alert and oriented  to person, place, and time.      UC Treatments / Results  Labs (all labs ordered are listed, but only abnormal results are displayed) Labs Reviewed - No data to display  EKG   Radiology No results found.  Procedures Procedures (including critical care time)  Medications Ordered in UC Medications - No data to display  Initial Impression / Assessment and Plan / UC Course  I have reviewed the triage vital signs and the nursing notes.  Pertinent labs & imaging results that were available during my care of the patient were reviewed by me and considered in my medical decision making (see chart for details).     EKG  ns 84  no acute changes  Pt has been having pain for 3 weeks. No risk.  I have advised follow up with your primary care physician for recheck  Final Clinical Impressions(s) / UC Diagnoses   Final diagnoses:  Nonspecific chest pain     Discharge Instructions      Return if any problems.    ED Prescriptions   None    PDMP not reviewed this encounter. An After Visit Summary was printed and given to the patient.       Elson Areas, [place] 11/02/22 1115

## 2022-11-02 NOTE — Discharge Instructions (Signed)
Return if any problems.

## 2022-11-05 ENCOUNTER — Ambulatory Visit: Admission: RE | Admit: 2022-11-05 | Payer: Medicaid Other | Source: Ambulatory Visit

## 2022-11-05 ENCOUNTER — Ambulatory Visit: Payer: Medicaid Other | Admitting: Internal Medicine

## 2022-11-05 ENCOUNTER — Encounter: Payer: Self-pay | Admitting: Internal Medicine

## 2022-11-05 VITALS — BP 142/92 | HR 95 | Temp 98.7°F | Ht 68.0 in | Wt 177.8 lb

## 2022-11-05 DIAGNOSIS — I300 Acute nonspecific idiopathic pericarditis: Secondary | ICD-10-CM

## 2022-11-05 DIAGNOSIS — R0789 Other chest pain: Secondary | ICD-10-CM | POA: Diagnosis not present

## 2022-11-05 DIAGNOSIS — F419 Anxiety disorder, unspecified: Secondary | ICD-10-CM

## 2022-11-05 MED ORDER — ALPRAZOLAM 0.5 MG PO TABS: 0.5000 mg | ORAL_TABLET | Freq: Two times a day (BID) | ORAL | 0 refills | Status: DC | PRN

## 2022-11-05 MED ORDER — COLCHICINE 0.6 MG PO CAPS: ORAL_CAPSULE | ORAL | 0 refills | Status: DC

## 2022-11-05 MED ORDER — CYCLOBENZAPRINE HCL 10 MG PO TABS
10.0000 mg | ORAL_TABLET | Freq: Three times a day (TID) | ORAL | 0 refills | Status: DC | PRN
Start: 2022-11-05 — End: 2023-08-29

## 2022-11-05 NOTE — Patient Instructions (Signed)
It was a pleasure seeing you today! Your health and satisfaction are our top priorities.  Glenetta Hew, MD  Your Providers PCP: Bethanie Dicker,  [phone]) Referring Provider: Allwardt, Milus Mallick,  [phone])  VISIT SUMMARY:  During your visit, we discussed your intermittent chest pain that you've been experiencing for the past three weeks. We also talked about your high stress levels and anxiety. After a thorough examination and considering your symptoms, we have identified three possible causes for your discomfort: Acute Idiopathic Pericarditis, Musculoskeletal Chest Pain, and Anxiety Disorder.  YOUR PLAN:  -ACUTE IDIOPATHIC PERICARDITIS: This is a condition where the sac-like covering around your heart (pericardium) becomes inflamed. We're starting you on a medication called Colchicine to help with this.  -MUSCULOSKELETAL CHEST PAIN: This is pain that comes from the structures in your chest like muscles and bones. You can use your prescribed Flexeril as needed for this.  -ANXIETY DISORDER: This is a mental health disorder characterized by feelings of worry, anxiety, or fear that are strong enough to interfere with one's daily activities. We're considering prescribing either Xanax or Klonopin to help manage your anxiety. However, it's important to be aware of the potential for addiction and to avoid mixing these medications with alcohol or opioids.  INSTRUCTIONS:  No follow-up appointment is required at this time. However, if your symptoms persist or worsen, please return for further evaluation. If your symptoms resolve, no further action is needed.   NEXT STEPS: [x]  Early Intervention: Schedule sooner appointment, call our on-call services, or go to emergency room if there is any significant Increase in pain or discomfort New or worsening symptoms Sudden or severe changes in your health [x]  Flexible Follow-Up: We recommend a No follow-ups on file. for optimal  routine care. This allows for progress monitoring and treatment adjustments. [x]  Preventive Care: Schedule your annual preventive care visit! It's typically covered by insurance and helps identify potential health issues early. [x]  Lab & X-ray Appointments: Incomplete tests scheduled today, or call to schedule. X-rays: [facility] (M-F, 8:30am-noon or 1pm-5pm). [x]  Medical Information Release: Sign a release form at front desk to obtain relevant medical information we don't have.  MAKING THE MOST OF OUR FOCUSED 20 MINUTE APPOINTMENTS: [x]   Clearly state your top concerns at the beginning of the visit to focus our discussion [x]   If you anticipate you will need more time, please inform the front desk during scheduling - we can book multiple appointments in the same week. [x]   If you have transportation problems- use our convenient video appointments or ask about transportation support. [x]   We can get down to business faster if you use MyChart to update information before the visit and submit non-urgent questions before your visit. Thank you for taking the time to provide details through MyChart.  Let our nurse know and she can import this information into your encounter documents.  Arrival and Wait Times: [x]   Arriving on time ensures that everyone receives prompt attention. [x]   Early morning (8a) and afternoon (1p) appointments tend to have shortest wait times. [x]   Unfortunately, we cannot delay appointments for late arrivals or hold slots during phone calls.  Getting Answers and Following Up [x]   Simple Questions & Concerns: For quick questions or basic follow-up after your visit, reach [place] at [phone] or MyChart messaging. [x]   Complex Concerns: If your concern is more complex, scheduling an appointment might be best. Discuss this with the staff to find the most suitable  option. [x]   Lab & Imaging Results: We'll contact you directly if results are abnormal or you don't  use MyChart. Most normal results will be on MyChart within 2-3 business days, with a review message from Dr. Jon Billings. Haven't heard back in 2 weeks? Need results sooner? Contact us at [phone]. [x]   Referrals: Our referral coordinator will manage specialist referrals. The specialist's office should contact you within 2 weeks to schedule an appointment. Call us if you haven't heard from them after 2 weeks.  Staying Connected [x]   MyChart: Activate your MyChart for the fastest way to access results and message [place]. See the last page of this paperwork for instructions on how to activate.  Bring to Your Next Appointment [x]   Medications: Please bring all your medication bottles to your next appointment to ensure we have an accurate record of your prescriptions. [x]   Health Diaries: If you're monitoring any health conditions at home, keeping a diary of your readings can be very helpful for discussions at your next appointment.  Billing [x]   X-ray & Lab Orders: These are billed by separate companies. Contact the invoicing company directly for questions or concerns. [x]   Visit Charges: Discuss any billing inquiries with our administrative services team.  Your Satisfaction Matters [x]   Share Your Experience: We strive for your satisfaction! If you have any complaints, or preferably compliments, please let Dr. Jon Billings know directly or contact our Practice Administrators, Edwena Felty or [employer], by asking at the front desk.   Reviewing Your Records [x]   Review this early draft of your clinical encounter notes below and the final encounter summary tomorrow on MyChart after its been completed.  All orders placed so far are visible here: Acute idiopathic pericarditis -     Colchicine; Day 1: Take 2 caps, then 1 cap an hour later. Day 2 and beyond: 1 cap daily  Dispense: 30 capsule; Refill: 0  Chest pain, musculoskeletal -     Cyclobenzaprine HCl; Take 1 tablet (10 mg total) by mouth 3  (three) times daily as needed for muscle spasms.  Dispense: 30 tablet; Refill: 0  Anxiety disorder, unspecified type -     ALPRAZolam; Take 1 tablet (0.5 mg total) by mouth 2 (two) times daily as needed for anxiety.  Dispense: 10 tablet; Refill: 0  Other chest pain -     DG Chest 2 View; Future

## 2022-11-05 NOTE — Progress Notes (Signed)
[facility]: [phone]   Routine Medical Office Visit  Patient:  Deborah Simpson      Age: 32 y.o.       Sex:  female  Date:   11/05/2022 Patient Care Team: Allwardt, Crist Infante, PA-C as PCP - General (Physician Assistant) Today's Healthcare Provider: Lula Olszewski, MD   Assessment and Plan:   Joseph was seen today for follow-up.  Acute idiopathic pericarditis -     Colchicine; Day 1: Take 2 caps, then 1 cap an hour later. Day 2 and beyond: 1 cap daily  Dispense: 30 capsule; Refill: 0  Chest pain, musculoskeletal -     Cyclobenzaprine HCl; Take 1 tablet (10 mg total) by mouth 3 (three) times daily as needed for muscle spasms.  Dispense: 30 tablet; Refill: 0  Anxiety disorder, unspecified type -     ALPRAZolam; Take 1 tablet (0.5 mg total) by mouth 2 (two) times daily as needed for anxiety.  Dispense: 10 tablet; Refill: 0  Other chest pain -     DG Chest 2 View; Future      Acute Idiopathic Pericarditis: She presents with chest pain that improves when leaning forward. There are no associated symptoms of pneumonia, history of trauma, or risk factors for a blood clot. An EKG was inconclusive for pericarditis. We will start Colchicine.  Musculoskeletal Chest Pain: Her chest pain may stem from stress and anxiety, showing no exacerbation with deep breaths or exertion, and no signs of pneumonia. She is advised to use Flexeril, which is already prescribed, as needed.  Anxiety Disorder: She reports high stress levels, panic attacks, and concerns about her health. We will prescribe either Xanax or Klonopin, cautioning her about the potential for addiction and advising against mixing with alcohol or opioids.  Follow-up: No follow-up is required. She should return if symptoms persist or worsen. If symptoms resolve, no further action is needed.     She does have 06/2022 strep throat.  Chief complaint: Intermittent sharp chest pain for approximately 3  weeks. Associated symptoms: Increased heart rate, anxiety, occasional pain radiating to the back. Aggravating factors: Stress. Alleviating factors: Leaning forward. Medical History Past medical history: No significant cardiac or pulmonary history. Medications: Birth control (Mirena IUD), potential muscle relaxer (Flexeril). Allergies: None mentioned. Surgical history: None mentioned. Family history: Breast cancer in mother at late 61s. Social history: Smoker: No, Alcohol: No, Drug use: No. Occupational history: Not relevant. Risk Factors Pulmonary embolism (PE): Low risk based on age, lack of risk factors (smoking, pregnancy, recent surgery, immobilization), and absence of classic PE symptoms. Esophageal rupture: Low risk due to absence of forceful vomiting, endoscopic procedures, and severe pain. Tension pneumothorax: Low risk due to absence of trauma, respiratory distress, and unilateral symptoms. Myocardial infarction (MI): Low risk based on age, gender, lack of risk factors (smoking, diabetes, hypertension), and atypical presentation. Aortic dissection: Extremely low risk due to age, gender, lack of trauma, and atypical pain presentation. Cardiac tamponade: Low risk due to absence of trauma, cancer, or recent MI. Diagnostic Assessment Physical exam: Normal heart sounds, no murmurs, clear lungs, no evidence of distress. ECG: Nonspecific changes, including an S1Q3 pattern suggestive of potential pulmonary hypertension but without supporting clinical findings. Differential Diagnosis Pericarditis: Most likely diagnosis based on chest pain relieved by leaning forward. Musculoskeletal chest pain: Possible contributing factor due to stress and anxiety. Anxiety disorder: Potential underlying cause of chest pain. Treatment Plan Xanax or Klonopin: To address anxiety and muscle relaxation. Muscle relaxer (Flexeril):  To manage musculoskeletal pain. Colchicine: To treat pericarditis. Presumed  most likely diagnosis due to relieved by leaning forward, no clear risk factors identified. Follow-up Patient advised to return if symptoms worsen or persist despite treatment. Low risk of serious underlying condition, but patient should be monitored for any changes. Should go to emergency room if worsening, explained. While the patient's symptoms are concerning, the overall clinical presentation is low risk for serious cardiac or pulmonary conditions. The diagnosis of pericarditis is likely, and treatment with anxiety management, muscle relaxants, and anti-inflammatory medication is appropriate.    Recommended follow up: No follow-ups on file.  No future appointments.         Clinical Presentation:    32 y.o. female who has ADD (attention deficit disorder); Family history of breast cancer in mother; Overweight (BMI 25.0-29.9); Spondylolisthesis at L5-S1 level; and Neoplasm of uncertain behavior of skin on their problem list. Her reasons/main concerns/chief complaints for today's office visit are Follow-up Deborah Simpson to ED on 7/26 for chest pains. EKG was done. Intermittent, still currently having mild chest pain. )   AI-Extracted: Discussed the use of AI scribe software for clinical note transcription with the patient, who gave verbal consent to proceed.  History of Present Illness   The patient, with no known history of heart or lung disease, presented with intermittent sharp chest pain for the past three weeks. The pain, described as sharp and located in the chest area, was noted to be sporadic and possibly stress-related. The patient reported an episode of tachycardia, which she associated with a panic attack. An EKG performed at an urgent care center was reported as slightly abnormal, but not concerning. The patient denied any history of heart issues, blood clots, or recent viral infections.  The patient also reported a high level of stress and anxiety, which she attributed to the loss of her  mother a few years ago. Despite this, she maintained an active lifestyle, including horse riding. The patient reported no change in the chest pain since her ER visit, with the pain neither worsening nor improving. She denied any leg swelling or pain and reported no smoking history. The patient was on Mirena IUD for birth control.  The patient also reported that the chest pain sometimes radiated to her upper left back, near the shoulder. She denied any recent falls or injuries. The patient reported no associated symptoms such as cold sweats, nausea, or cough. She did note that leaning forward seemed to alleviate the chest pain. The patient was on no other medications at the time of the consultation.      She  has a past medical history of Abnormal pap (09/08), Complication of anesthesia, Medical history non-contributory, and PONV (postoperative nausea and vomiting). Current Outpatient Medications on File Prior to Visit  Medication Sig   levonorgestrel (MIRENA, 52 MG,) 20 MCG/DAY IUD Take 1 device by intrauterine route.   Multiple Vitamin (MULTIVITAMIN PO) Take by mouth daily.   No current facility-administered medications on file prior to visit.   Medications Discontinued During This Encounter  Medication Reason   oxyCODONE (ROXICODONE) 15 MG immediate release tablet    azithromycin (ZITHROMAX) 250 MG tablet          Clinical Data Analysis:   Physical Exam  Pulse 95   Temp 98.7 F (37.1 C) (Temporal)   Ht 5\' 8"  (1.727 m)   Wt 177 lb 12.8 oz (80.6 kg)   SpO2 96%   BMI 27.03 kg/m  Wt Readings from Last  10 Encounters:  11/05/22 177 lb 12.8 oz (80.6 kg)  04/25/22 188 lb 12.8 oz (85.6 kg)  10/04/17 173 lb (78.5 kg)  02/18/17 167 lb (75.8 kg)  12/05/16 205 lb (93 kg)  08/14/16 180 lb 6.4 oz (81.8 kg)  04/12/16 167 lb (75.8 kg)  04/11/16 167 lb 12.8 oz (76.1 kg)  01/26/16 165 lb 9.6 oz (75.1 kg)  09/22/15 164 lb (74.4 kg)   Vital signs reviewed.  Nursing notes reviewed. Weight  trend reviewed. Abnormalities and Problem-Specific physical exam findings:  Normal heart sounds, no murmurs, clear lungs, no evidence of distress. She is anxious about the pain, which is not even present right now.  General Appearance:  No acute distress appreciable.   Well-groomed, healthy-appearing female.  Well proportioned with no abnormal fat distribution.  Good muscle tone. Skin: Clear and well-hydrated. Pulmonary:  Normal work of breathing at rest, no respiratory distress apparent. SpO2: 96 %  Musculoskeletal: All extremities are intact.  Neurological:  Awake, alert, oriented, and engaged.  No obvious focal neurological deficits or cognitive impairments.  Sensorium seems unclouded.   Speech is clear and coherent with logical content. Psychiatric:  Appropriate mood, pleasant and cooperative demeanor, thoughtful and engaged during the exam  Results Reviewed:      No results found for any visits on 11/05/22.  Admission on 06/12/2022, Discharged on 06/12/2022  Component Date Value   Rapid Strep A Screen 06/12/2022 Positive (A)    No image results found.   No results found.  No results found.    This encounter employed real-time, collaborative documentation. The patient actively reviewed and updated their medical record on a shared screen, ensuring transparency and facilitating joint problem-solving for the problem list, overview, and plan. This approach promotes accurate, informed care. The treatment plan was discussed and reviewed in detail, including medication safety, potential side effects, and all patient questions. We confirmed understanding and comfort with the plan. Follow-up instructions were established, including contacting the office for any concerns, returning if symptoms worsen, persist, or new symptoms develop, and precautions for potential emergency department visits. ----------------------------------------------------- Lula Olszewski, MD  11/05/2022 7:30 PM  [facility]:  [phone]

## 2022-11-06 ENCOUNTER — Ambulatory Visit: Payer: Medicaid Other | Admitting: Physician Assistant

## 2022-11-06 ENCOUNTER — Encounter: Payer: Self-pay | Admitting: Internal Medicine

## 2022-11-06 DIAGNOSIS — I300 Acute nonspecific idiopathic pericarditis: Secondary | ICD-10-CM | POA: Insufficient documentation

## 2022-11-06 NOTE — Progress Notes (Signed)
Please follow up with the patient by phone if they haven't viewed their MyChart results within 5 days. Inform them that their chest X-ray results from 11/06/2022 are available. Summarize the key findings: 1) Normal heart size and clear lungs 2) No active cardiopulmonary disease identified  Remind the patient to continue their medications as prescribed, especially the Colchicine for pericarditis. Emphasize the importance of monitoring their symptoms and seeking immediate medical attention if chest pain worsens or new symptoms develop. Offer to schedule a follow-up appointment in 2 weeks. Ask if they have any questions or concerns about the results or their treatment plan.

## 2022-11-27 ENCOUNTER — Other Ambulatory Visit: Payer: Self-pay | Admitting: Internal Medicine

## 2022-11-27 DIAGNOSIS — I300 Acute nonspecific idiopathic pericarditis: Secondary | ICD-10-CM

## 2022-12-18 ENCOUNTER — Other Ambulatory Visit: Payer: Self-pay | Admitting: Internal Medicine

## 2022-12-18 DIAGNOSIS — F419 Anxiety disorder, unspecified: Secondary | ICD-10-CM

## 2022-12-20 NOTE — Telephone Encounter (Signed)
Sent my chart message informing patient to follow-up with her PCP if she would like this prescription renewed.

## 2022-12-31 ENCOUNTER — Ambulatory Visit: Payer: Medicaid Other | Admitting: Physician Assistant

## 2023-01-11 ENCOUNTER — Ambulatory Visit: Payer: Medicaid Other | Admitting: Physician Assistant

## 2023-03-29 ENCOUNTER — Encounter: Payer: Medicaid Other | Admitting: Physician Assistant

## 2023-08-29 ENCOUNTER — Encounter: Payer: Self-pay | Admitting: Family

## 2023-08-29 ENCOUNTER — Ambulatory Visit (INDEPENDENT_AMBULATORY_CARE_PROVIDER_SITE_OTHER): Admitting: Family

## 2023-08-29 ENCOUNTER — Telehealth: Payer: Self-pay

## 2023-08-29 VITALS — BP 117/83 | HR 68 | Temp 98.2°F | Ht 68.0 in | Wt 186.0 lb

## 2023-08-29 DIAGNOSIS — H1013 Acute atopic conjunctivitis, bilateral: Secondary | ICD-10-CM

## 2023-08-29 DIAGNOSIS — O4100X0 Oligohydramnios, unspecified trimester, not applicable or unspecified: Secondary | ICD-10-CM | POA: Insufficient documentation

## 2023-08-29 DIAGNOSIS — D582 Other hemoglobinopathies: Secondary | ICD-10-CM | POA: Insufficient documentation

## 2023-08-29 HISTORY — DX: Other hemoglobinopathies: D58.2

## 2023-08-29 HISTORY — DX: Oligohydramnios, unspecified trimester, not applicable or unspecified: O41.00X0

## 2023-08-29 MED ORDER — AZELASTINE HCL 0.05 % OP SOLN: 1.0000 [drp] | Freq: Two times a day (BID) | OPHTHALMIC | 2 refills | Status: DC

## 2023-08-29 NOTE — Progress Notes (Signed)
 Patient ID: Deborah Simpson, female    DOB: May 15, 1990, 33 y.o.   MRN: [id number]  Chief Complaint  Patient presents with   Eye Problem    Pt c/o eye redness, watery and itchy. Has tried eye drops which makes sx worse. Present for 3 weeks.   Discussed the use of AI scribe software for clinical note transcription with the patient, who gave verbal consent to proceed.  History of Present Illness Deborah Simpson is a 33 year old female who presents with bilateral eye irritation and seasonal allergy symptoms.  She experiences watery, itchy, and red eyes. She uses over-the-counter allergy drops, including Zaditor and generic Pataday, twice daily and a comfort drop, Visine, for red eyes, which provide temporary relief. Symptoms worsen by the end of the day. Her symptoms are seasonal, having started a couple of years ago in the fall and now occurring for the first time in the spring. Both eyes are affected, with no pain or crustiness. She has nasal stuffiness today. There is no runny nose, sneezing, coughing, or itchy throat. She takes Zyrtec in the morning to help with her symptoms.  Assessment & Plan Allergic conjunctivitis - Bilateral eye redness, itching, and watering consistent with allergic conjunctivitis. Symptoms seasonal, recurring in spring. Non-infectious etiology supported by absence of pain or crustiness. Previous OTC drops provided temporary relief. - Prescribed azelastine eye drops, one drop in each eye twice daily. - Recommended fluticasone nasal spray for nasal congestion and all sinus/allergy symptoms, one spray in each nostril twice daily, then reduce to once daily as symptoms improve, ok to use with the Astelin eye drops. - Advised to report if symptoms do not improve.   Subjective:     Outpatient Medications Prior to Visit  Medication Sig Dispense Refill   levonorgestrel (MIRENA, 52 MG,) 20 MCG/DAY IUD Take 1 device by intrauterine route.     Multiple Vitamin  (MULTIVITAMIN PO) Take by mouth daily.     ALPRAZolam  (XANAX ) 0.5 MG tablet Take 1 tablet (0.5 mg total) by mouth 2 (two) times daily as needed for anxiety. (Patient not taking: Reported on 08/29/2023) 10 tablet 0   Colchicine  0.6 MG CAPS DAY 1: TAKE 2 CAPS, THEN 1 CAP AN HOUR LATER. DAY 2 AND BEYOND: 1 CAP DAILY (Patient not taking: Reported on 08/29/2023) 90 capsule 1   cyclobenzaprine  (FLEXERIL ) 10 MG tablet Take 1 tablet (10 mg total) by mouth 3 (three) times daily as needed for muscle spasms. (Patient not taking: Reported on 08/29/2023) 30 tablet 0   oxyCODONE  (ROXICODONE ) 15 MG immediate release tablet Take 15 mg by mouth.     No facility-administered medications prior to visit.   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 MFM scan normal AFI     PONV (postoperative nausea and vomiting)    Pregnant 05/28/2016   Past Surgical History:  Procedure Laterality Date   DILATION AND EVACUATION N/A 02/18/2017   Procedure: DILATATION AND EVACUATION;  Surgeon: Artemisa Bile, MD;  Location: [facility];  Service: Gynecology;  Laterality: N/A;   NO PAST SURGERIES     WISDOM TOOTH EXTRACTION N/A at 33 yo   Allergies  Allergen Reactions   Amoxicillin  Rash    Developed rash when begun on amoxil  for strep. New allergy noted in chart today      Objective:    Physical Exam Vitals and nursing note reviewed.  Constitutional:      Appearance: Normal appearance.  Eyes:     Extraocular Movements: Extraocular movements intact.     Conjunctiva/sclera:     Right eye: Right conjunctiva is injected. No exudate.    Left eye: Left conjunctiva is injected. No exudate. Cardiovascular:     Rate and Rhythm: Normal rate and regular  rhythm.  Pulmonary:     Effort: Pulmonary effort is normal.     Breath sounds: Normal breath sounds.  Musculoskeletal:        General: Normal range of motion.  Skin:    General: Skin is warm and dry.  Neurological:     Mental Status: She is alert.  Psychiatric:        Mood and Affect: Mood normal.        Behavior: Behavior normal.    BP 117/83 (BP Location: Left Arm, Cuff Size: Large)   Pulse 68   Temp 98.2 F (36.8 C) (Temporal)   Ht 5\' 8"  (1.727 m)   Wt 186 lb (84.4 kg)   SpO2 98%   BMI 28.28 kg/m  Wt Readings from Last 3 Encounters:  08/29/23 186 lb (84.4 kg)  11/05/22 177 lb 12.8 oz (80.6 kg)  04/25/22 188 lb 12.8 oz (85.6 kg)      Versa Gore, NP

## 2023-08-29 NOTE — Telephone Encounter (Signed)
 Copied from CRM [id number]. Topic: Clinical - Medication Prior Auth >> Aug 29, 2023 10:43 AM Marlan Silva wrote: Reason for CRM: Patient called in stating that she was recently seen in office and prescribed some eye drops. She states the pharmacy said the drops required a prior authorization and she wanted to inform the nurse. Please contact patient for update [phone].  Please see pt call note and advise on PA determination for patient.

## 2023-08-29 NOTE — Telephone Encounter (Signed)
 sent request to PA team

## 2023-08-30 ENCOUNTER — Other Ambulatory Visit (HOSPITAL_COMMUNITY): Payer: Self-pay

## 2023-08-30 ENCOUNTER — Telehealth: Payer: Self-pay

## 2023-08-30 DIAGNOSIS — H1013 Acute atopic conjunctivitis, bilateral: Secondary | ICD-10-CM

## 2023-08-30 MED ORDER — CROMOLYN SODIUM 4 % OP SOLN
1.0000 [drp] | Freq: Four times a day (QID) | OPHTHALMIC | 1 refills | Status: AC
Start: 2023-08-30 — End: ?

## 2023-08-30 NOTE — Telephone Encounter (Signed)
 Pharmacy Patient Advocate Encounter   Received notification from Pt Calls Messages that prior authorization for Azelastine 0.05% solution is required/requested.   Insurance verification completed.   The patient is insured through [employer] .   Per test plan:  -Cromolyn sodium drops (generic for Crolom) -Olopatadine drops ( generic for Pataday, Patanol)  are preferred by the insurance. Patient must trial and fail two preferred drugs to consider covering Azelastine.

## 2023-08-30 NOTE — Telephone Encounter (Signed)
 let her know I have sent in the cromolyn to try, thx.

## 2023-08-30 NOTE — Telephone Encounter (Signed)
 Please see call note regarding PA for recent prescription and advise patient

## 2023-08-30 NOTE — Telephone Encounter (Signed)
I called pt and lvm in regards.

## 2023-09-09 ENCOUNTER — Other Ambulatory Visit (HOSPITAL_COMMUNITY): Payer: Self-pay

## 2023-10-08 ENCOUNTER — Other Ambulatory Visit: Payer: Self-pay

## 2023-10-08 ENCOUNTER — Ambulatory Visit
Admission: RE | Admit: 2023-10-08 | Discharge: 2023-10-08 | Disposition: A | Discharge status: Home / Self Care | Payer: Self-pay | Source: Ambulatory Visit | Attending: Family Medicine | Admitting: Family Medicine

## 2023-10-08 VITALS — BP 156/92 | HR 91 | Temp 99.1°F | Resp 18

## 2023-10-08 DIAGNOSIS — R300 Dysuria: Secondary | ICD-10-CM | POA: Insufficient documentation

## 2023-10-08 DIAGNOSIS — Z202 Contact with and (suspected) exposure to infections with a predominantly sexual mode of transmission: Secondary | ICD-10-CM | POA: Insufficient documentation

## 2023-10-08 DIAGNOSIS — R070 Pain in throat: Secondary | ICD-10-CM | POA: Insufficient documentation

## 2023-10-08 LAB — POCT URINALYSIS DIP (MANUAL ENTRY)
Bilirubin, UA: NEGATIVE
Glucose, UA: NEGATIVE mg/dL
Ketones, POC UA: NEGATIVE mg/dL
Leukocytes, UA: NEGATIVE
Nitrite, UA: NEGATIVE
Protein Ur, POC: NEGATIVE mg/dL
Spec Grav, UA: 1.025 (ref 1.010–1.025)
Urobilinogen, UA: 0.2 U/dL
pH, UA: 7 (ref 5.0–8.0)

## 2023-10-08 LAB — POCT URINE PREGNANCY: Preg Test, Ur: NEGATIVE

## 2023-10-08 NOTE — Discharge Instructions (Addendum)
 The urinalysis showed only a trace of red blood cells. The pregnancy test was negative.  Urine culture is sent and our staff will notify you if it looks like there is a urinary tract infection on the culture.  Staff will notify you if there is anything positive on the swabs.

## 2023-10-08 NOTE — ED Provider Notes (Signed)
 [facility]    CSN: [id number] Arrival date & time: 10/08/23  1303      History   Chief Complaint Chief Complaint  Patient presents with   Exposure to STD    some pain while urination. just want a check up to make sure . - Entered by patient   Dysuria    HPI Deborah Simpson is a 33 y.o. female.    Exposure to STD  Dysuria Here for about 3 days of mild dysuria.  Not really a lot of urinary frequency.  No fever or vomiting.  She had occasional mild abdominal pain.  No vaginal discharge or itching.  She has not had a menstrual cycle in a while since she has an IUD.  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   Overweight (BMI 25.0-29.9) 04/01/2018   Family history of breast cancer in mother 01/27/2016   ADD (attention deficit disorder) 07/12/2015    Past Surgical History:  Procedure Laterality Date   DILATION AND EVACUATION N/A 02/18/2017   Procedure: DILATATION AND EVACUATION;  Surgeon: Bettina Muskrat, MD;  Location: [facility];  Service: Gynecology;  Laterality: N/A;   NO PAST SURGERIES     WISDOM TOOTH EXTRACTION N/A at 33 yo    OB History     Gravida  2   Para  1   Term  1   Preterm      AB  1   Living  1      SAB      IAB  1   Ectopic      Multiple  0   Live Births  1            Home Medications     Prior to Admission medications   Medication Sig Start Date End Date Taking? Authorizing Provider  cromolyn  (OPTICROM ) 4 % ophthalmic solution Place 1 drop into both eyes 4 (four) times daily. 08/30/23   Lucius Krabbe, NP  levonorgestrel (MIRENA, 52 MG,) 20 MCG/DAY IUD Take 1 device by intrauterine route. 04/13/17   [provider]  Multiple Vitamin (MULTIVITAMIN PO) Take by mouth daily.    [provider]  oxyCODONE  (ROXICODONE ) 15 MG immediate release tablet Take 15 mg by mouth.    [provider]    Family History Family History  Problem Relation Age of Onset   Breast cancer Mother 35   Diabetes Father     Social History Social History   Tobacco Use   Smoking status: Never   Smokeless tobacco: Never  Substance Use Topics   Alcohol use: No   Drug use: No     Allergies   Amoxicillin    Review of Systems Review of Systems  Genitourinary:  Positive for dysuria.  Physical Exam Triage Vital Signs ED Triage Vitals  Encounter Vitals Group     BP 10/08/23 1314 (!) 156/92     Girls Systolic BP Percentile --      Girls Diastolic BP Percentile --      Boys Systolic BP Percentile --      Boys Diastolic BP Percentile --      Pulse Rate 10/08/23 1314 91     Resp 10/08/23 1314 18     Temp 10/08/23 1314 99.1 F (37.3 C)     Temp Source 10/08/23 1314 Oral     SpO2 10/08/23 1314 96 %     Weight --      Height --      Head Circumference --      Peak Flow --      Pain Score 10/08/23 1315 0     Pain Loc --      Pain Education --      Exclude from Growth Chart --    No data found.  Updated Vital Signs BP (!) 156/92 (BP Location: Left Arm)   Pulse 91   Temp 99.1 F (37.3 C) (Oral)   Resp 18   SpO2 96%   Visual Acuity Right Eye Distance:   Left Eye Distance:   Bilateral Distance:    Right Eye Near:   Left Eye Near:    Bilateral Near:     Physical Exam Vitals reviewed.  Constitutional:      General: She is not in acute  distress.    Appearance: She is not toxic-appearing.  HENT:     Right Ear: Ear canal normal.     Mouth/Throat:     Mouth: Mucous membranes are moist.     Pharynx: No oropharyngeal exudate or posterior oropharyngeal erythema.   Cardiovascular:     Rate and Rhythm: Normal rate and regular rhythm.     Heart sounds: No murmur heard. Pulmonary:     Effort: Pulmonary effort is normal. No respiratory distress.     Breath sounds: No stridor. No wheezing, rhonchi or rales.  Chest:     Chest wall: No tenderness.  Abdominal:     General: There is no distension.     Palpations: Abdomen is soft.     Tenderness: There is no abdominal tenderness.   Musculoskeletal:     Cervical back: Neck supple.  Lymphadenopathy:     Cervical: No cervical adenopathy.   Skin:    Capillary Refill: Capillary refill takes less than 2 seconds.     Coloration: Skin is not jaundiced or pale.   Neurological:     General: No focal deficit present.     Mental Status: She is alert and oriented to person, place, and time.   Psychiatric:        Behavior: Behavior normal.      UC Treatments / Results  Labs (all labs ordered are listed, but only abnormal results are displayed) Labs Reviewed  POCT URINALYSIS DIP (MANUAL ENTRY) - Abnormal; Notable for the following components:      Result Value   Blood, UA trace-intact (*)    All other components within normal limits  POCT URINE PREGNANCY - Normal  URINE CULTURE  CYTOLOGY, (ORAL, ANAL, URETHRAL) ANCILLARY ONLY  CERVICOVAGINAL ANCILLARY ONLY    EKG   Radiology No results found.  Procedures Procedures (including critical care time)  Medications Ordered in UC Medications - No data to display  Initial Impression / Assessment and Plan /  UC Course  I have reviewed the triage vital signs and the nursing notes.  Pertinent labs & imaging results that were available during my care of the patient were reviewed by me and considered in my medical decision  making (see chart for details).     Urinalysis shows only a trace of red blood cells.  UPT is negative.  Urine culture sent we will notify her and treat protocol if that is positive for UTI  Vaginal self swab is done, and we will notify of any positives on that and treat per protocol.  I also did an oropharyngeal swab since she had had some throat pain that is now resolved. Final Clinical Impressions(s) / UC Diagnoses   Final diagnoses:  Dysuria  Throat pain  Potential exposure to STD     Discharge Instructions      The urinalysis showed only a trace of red blood cells. The pregnancy test was negative.  Urine culture is sent and our staff will notify you if it looks like there is a urinary tract infection on the culture.  Staff will notify you if there is anything positive on the swabs.      ED Prescriptions   None    PDMP not reviewed this encounter.   Vonna Sharlet POUR, MD 10/08/23 [phone]

## 2023-10-08 NOTE — ED Triage Notes (Addendum)
 Pt here for dysuria x 3 days; pt sts had sore throat but now resolved; denies discharge

## 2023-10-09 ENCOUNTER — Ambulatory Visit (HOSPITAL_COMMUNITY): Payer: Self-pay

## 2023-10-09 LAB — CYTOLOGY, (ORAL, ANAL, URETHRAL) ANCILLARY ONLY
Chlamydia: NEGATIVE
Comment: NEGATIVE
Comment: NORMAL
Neisseria Gonorrhea: NEGATIVE

## 2023-10-09 LAB — URINE CULTURE: Culture: NO GROWTH

## 2023-10-10 LAB — CERVICOVAGINAL ANCILLARY ONLY
Bacterial Vaginitis (gardnerella): NEGATIVE
Chlamydia: NEGATIVE
Comment: NEGATIVE
Comment: NEGATIVE
Comment: NEGATIVE
Comment: NORMAL
Neisseria Gonorrhea: NEGATIVE
Trichomonas: NEGATIVE

## 2024-02-12 ENCOUNTER — Telehealth: Payer: Self-pay

## 2024-02-12 NOTE — Telephone Encounter (Signed)
 Please read message below and advise. Tks   Copied from CRM [id number]. Topic: Clinical - Medication Question >> Feb 12, 2024  2:47 PM Thersia C wrote: Reason for CRM:  Patient is going on a cruise in a couple of weeks, not sure if she get motion sickness would like to know if she could get the patches for motion sickness sent to   [id number] [address],  - [address] [place] [address] Phone: [phone] Fax: [phone]

## 2024-02-13 ENCOUNTER — Other Ambulatory Visit: Payer: Self-pay | Admitting: Physician Assistant

## 2024-02-13 NOTE — Telephone Encounter (Signed)
 Please call patient to make an OV or e-visit please.    Recommend office visit or e-visit (it has been > 1 year since last visit).

## 2024-02-17 ENCOUNTER — Encounter: Payer: Self-pay | Admitting: Physician Assistant

## 2024-02-17 ENCOUNTER — Ambulatory Visit: Admitting: Physician Assistant

## 2024-02-17 VITALS — BP 126/84 | HR 76 | Temp 99.0°F | Ht 68.0 in | Wt 179.2 lb

## 2024-02-17 DIAGNOSIS — Z23 Encounter for immunization: Secondary | ICD-10-CM | POA: Diagnosis not present

## 2024-02-17 DIAGNOSIS — T753XXA Motion sickness, initial encounter: Secondary | ICD-10-CM

## 2024-02-17 MED ORDER — SCOPOLAMINE 1 MG/3DAYS TD PT72: 1.0000 | MEDICATED_PATCH | TRANSDERMAL | 0 refills | Status: AC

## 2024-02-17 NOTE — Progress Notes (Signed)
 Patient ID: Deborah Simpson, female    DOB: 1990/09/16, 33 y.o.   MRN: [id number]   Assessment & Plan:  Motion sickness, initial encounter  Immunization due -     Flu vaccine trivalent PF, 6mos and older(Flulaval,Afluria,Fluarix,Fluzone)  Other orders -     Scopolamine ; Place 1 patch (1 mg total) onto the skin every 3 (three) days.  Dispense: 10 patch; Refill: 0     Assessment & Plan Encounter for immunization Due for flu vaccination, especially important due to upcoming travel and exposure to large groups.  Motion sickness Experiences motion sickness in cars and on floating docks. Planning a five-day cruise and concerned about potential motion sickness. No prior use of motion sickness patches, but open to using them as a precaution. - Prescribed 10 motion sickness patches - Advised to start using patches before symptoms occur - Discussed potential side effects of patches, including dry mouth, dry eyes, and dizziness - Advised to stay well hydrated      Return if symptoms worsen or fail to improve.    Subjective:    Chief Complaint  Patient presents with   Medical Management of Chronic Issues    Pt in office to discuss motion sickness patches; pt is going in a cruise and worried she may get sick since sensitive to motion, ex car sickness etc.     HPI Discussed the use of AI scribe software for clinical note transcription with the patient, who gave verbal consent to proceed.  History of Present Illness Deborah Simpson is a 33 year old female who presents for a flu vaccine and to discuss motion sickness patches.  She is planning to go on a cruise soon and will be around many people, prompting her to seek a flu vaccine. She has never had issues with flu vaccines in the past and is considering getting her daughter's flu shot as well.  She experiences motion sickness, particularly in cars and [place]. She has not been on a cruise since she  was 33 or 33 years old, at which time she did not experience motion sickness. However, she is concerned about potential motion sickness on the upcoming cruise, especially since her husband experienced it on a previous cruise. She prefers to be 'safe than sorry' and is considering using motion sickness patches as a preventive measure.  She is planning to go on a cruise and will be flying to [place] . No dermatological concerns or changes reported. The patient is unsure if gynecology has done any recent blood work and reports it has been a while since her last visit.     Past Medical History:  Diagnosis Date   Abnormal pap 12/2006   CIN 1   Complication of anesthesia    Hemoglobin C trait 08/29/2023   FOB normal HB AA     Mastitis 12/25/2016   Mastitis without abscess 12/25/2016   Medical history non-contributory    Oligohydramnios 08/29/2023   early in preg 22 weeks referral to MFM. MFM scan normal AFI     PONV (postoperative nausea and vomiting)    Pregnant 05/28/2016    Past Surgical History:  Procedure Laterality Date   DILATION AND EVACUATION N/A 02/18/2017   Procedure: DILATATION AND EVACUATION;  Surgeon: Bettina Muskrat, MD;  Location: [facility];  Service: Gynecology;  Laterality: N/A;   NO PAST SURGERIES     WISDOM TOOTH EXTRACTION N/A at 33 yo    Family History  Problem Relation Age of Onset   Breast cancer Mother 40   Diabetes Father     Social History   Tobacco Use   Smoking status: Never   Smokeless tobacco: Never  Substance Use Topics   Alcohol use: No   Drug use: No     Allergies  Allergen Reactions   Amoxicillin  Rash    Developed rash when begun on amoxil  for strep. New allergy noted in chart today    Review of Systems NEGATIVE UNLESS OTHERWISE INDICATED IN HPI      Objective:     BP 126/84 (BP Location: Left Arm, Patient Position: Sitting, Cuff Size: Normal)   Pulse 76   Temp 99 F (37.2 C) (Temporal)   Ht 5' 8 (1.727 m)   Wt 179 lb 3.2 oz  (81.3 kg)   SpO2 99%   BMI 27.25 kg/m   Wt Readings from Last 3 Encounters:  02/17/24 179 lb 3.2 oz (81.3 kg)  08/29/23 186 lb (84.4 kg)  11/05/22 177 lb 12.8 oz (80.6 kg)    BP Readings from Last 3 Encounters:  02/17/24 126/84  10/08/23 (!) 156/92  08/29/23 117/83     Physical Exam Vitals and nursing note reviewed.  Constitutional:      Appearance: Normal appearance.  Eyes:     Extraocular Movements: Extraocular movements intact.     Conjunctiva/sclera: Conjunctivae normal.     Pupils: Pupils are equal, round, and reactive to light.  Cardiovascular:     Rate and Rhythm: Normal rate and regular rhythm.  Pulmonary:     Effort: Pulmonary effort is normal.     Breath sounds: Normal breath sounds.  Neurological:     General: No focal deficit present.     Mental Status: She is alert and oriented to person, place, and time.  Psychiatric:        Mood and Affect: Mood normal.        Behavior: Behavior normal.             Miyah Hampshire M Alycea Segoviano, PA-C

## 2024-02-25 ENCOUNTER — Telehealth: Payer: Self-pay

## 2024-02-25 ENCOUNTER — Other Ambulatory Visit: Payer: Self-pay | Admitting: Physician Assistant

## 2024-02-25 MED ORDER — AZITHROMYCIN 500 MG PO TABS
ORAL_TABLET | ORAL | 0 refills | Status: AC
Start: 2024-02-25 — End: ?

## 2024-02-25 NOTE — Telephone Encounter (Signed)
 Copied from CRM #[id number]. Topic: Clinical - Medication Question >> Feb 25, 2024  1:23 PM Deborah Simpson wrote: Reason for CRM: Patient called in wanting to know if [facility] could send in [employer] due to going [place] the [place]. Please call to confirm  Please see pt request and advise
